# Patient Record
Sex: Male | Born: 1935 | Race: White | Hispanic: No | Marital: Married | Smoking: Former smoker
Health system: Southern US, Community
[De-identification: ages and names within clinical notes are randomized; demographics above are authoritative.]

## PROBLEM LIST (undated history)

## (undated) DIAGNOSIS — E872 Acidosis, unspecified: Secondary | ICD-10-CM

## (undated) DIAGNOSIS — I5021 Acute systolic (congestive) heart failure: Secondary | ICD-10-CM

## (undated) DIAGNOSIS — R7881 Bacteremia: Secondary | ICD-10-CM

## (undated) DIAGNOSIS — J151 Pneumonia due to Pseudomonas: Secondary | ICD-10-CM

## (undated) HISTORY — PX: HERNIA REPAIR: SHX51

## (undated) HISTORY — DX: Pneumonia due to Pseudomonas: J15.1

## (undated) HISTORY — DX: Acute systolic (congestive) heart failure: I50.21

## (undated) HISTORY — DX: Bacteremia: R78.81

## (undated) HISTORY — PX: CHOLECYSTECTOMY: SHX55

## (undated) HISTORY — DX: Acidosis, unspecified: E87.20

---

## 2014-08-28 DIAGNOSIS — R7301 Impaired fasting glucose: Secondary | ICD-10-CM | POA: Diagnosis not present

## 2014-08-28 DIAGNOSIS — Z1389 Encounter for screening for other disorder: Secondary | ICD-10-CM | POA: Diagnosis not present

## 2014-08-28 DIAGNOSIS — R972 Elevated prostate specific antigen [PSA]: Secondary | ICD-10-CM | POA: Diagnosis not present

## 2014-08-28 DIAGNOSIS — Z9181 History of falling: Secondary | ICD-10-CM | POA: Diagnosis not present

## 2014-08-28 DIAGNOSIS — Z139 Encounter for screening, unspecified: Secondary | ICD-10-CM | POA: Diagnosis not present

## 2014-08-28 DIAGNOSIS — Z0000 Encounter for general adult medical examination without abnormal findings: Secondary | ICD-10-CM | POA: Diagnosis not present

## 2014-08-28 DIAGNOSIS — Z125 Encounter for screening for malignant neoplasm of prostate: Secondary | ICD-10-CM | POA: Diagnosis not present

## 2014-08-28 DIAGNOSIS — E785 Hyperlipidemia, unspecified: Secondary | ICD-10-CM | POA: Diagnosis not present

## 2014-11-03 DIAGNOSIS — H25811 Combined forms of age-related cataract, right eye: Secondary | ICD-10-CM | POA: Diagnosis not present

## 2014-12-15 DIAGNOSIS — D1801 Hemangioma of skin and subcutaneous tissue: Secondary | ICD-10-CM | POA: Diagnosis not present

## 2014-12-15 DIAGNOSIS — L720 Epidermal cyst: Secondary | ICD-10-CM | POA: Diagnosis not present

## 2014-12-15 DIAGNOSIS — L821 Other seborrheic keratosis: Secondary | ICD-10-CM | POA: Diagnosis not present

## 2015-02-27 DIAGNOSIS — Z23 Encounter for immunization: Secondary | ICD-10-CM | POA: Diagnosis not present

## 2015-03-11 DIAGNOSIS — J208 Acute bronchitis due to other specified organisms: Secondary | ICD-10-CM | POA: Diagnosis not present

## 2015-03-11 DIAGNOSIS — Z6821 Body mass index (BMI) 21.0-21.9, adult: Secondary | ICD-10-CM | POA: Diagnosis not present

## 2015-03-11 DIAGNOSIS — H6123 Impacted cerumen, bilateral: Secondary | ICD-10-CM | POA: Diagnosis not present

## 2015-07-10 DIAGNOSIS — J309 Allergic rhinitis, unspecified: Secondary | ICD-10-CM | POA: Diagnosis not present

## 2015-07-10 DIAGNOSIS — Z6821 Body mass index (BMI) 21.0-21.9, adult: Secondary | ICD-10-CM | POA: Diagnosis not present

## 2015-07-10 DIAGNOSIS — J019 Acute sinusitis, unspecified: Secondary | ICD-10-CM | POA: Diagnosis not present

## 2015-08-31 DIAGNOSIS — R7301 Impaired fasting glucose: Secondary | ICD-10-CM | POA: Diagnosis not present

## 2015-08-31 DIAGNOSIS — Z0000 Encounter for general adult medical examination without abnormal findings: Secondary | ICD-10-CM | POA: Diagnosis not present

## 2015-08-31 DIAGNOSIS — Z1389 Encounter for screening for other disorder: Secondary | ICD-10-CM | POA: Diagnosis not present

## 2015-08-31 DIAGNOSIS — Z9181 History of falling: Secondary | ICD-10-CM | POA: Diagnosis not present

## 2015-08-31 DIAGNOSIS — E785 Hyperlipidemia, unspecified: Secondary | ICD-10-CM | POA: Diagnosis not present

## 2015-08-31 DIAGNOSIS — Z23 Encounter for immunization: Secondary | ICD-10-CM | POA: Diagnosis not present

## 2015-08-31 DIAGNOSIS — R972 Elevated prostate specific antigen [PSA]: Secondary | ICD-10-CM | POA: Diagnosis not present

## 2015-09-08 DIAGNOSIS — C61 Malignant neoplasm of prostate: Secondary | ICD-10-CM | POA: Diagnosis not present

## 2015-09-08 DIAGNOSIS — N403 Nodular prostate with lower urinary tract symptoms: Secondary | ICD-10-CM | POA: Diagnosis not present

## 2015-09-08 DIAGNOSIS — N4232 Atypical small acinar proliferation of prostate: Secondary | ICD-10-CM | POA: Diagnosis not present

## 2015-09-08 DIAGNOSIS — R972 Elevated prostate specific antigen [PSA]: Secondary | ICD-10-CM | POA: Diagnosis not present

## 2015-09-08 DIAGNOSIS — N401 Enlarged prostate with lower urinary tract symptoms: Secondary | ICD-10-CM | POA: Diagnosis not present

## 2015-09-15 DIAGNOSIS — R972 Elevated prostate specific antigen [PSA]: Secondary | ICD-10-CM | POA: Diagnosis not present

## 2015-09-15 DIAGNOSIS — C61 Malignant neoplasm of prostate: Secondary | ICD-10-CM | POA: Diagnosis not present

## 2015-09-15 DIAGNOSIS — N403 Nodular prostate with lower urinary tract symptoms: Secondary | ICD-10-CM | POA: Diagnosis not present

## 2015-09-29 DIAGNOSIS — C61 Malignant neoplasm of prostate: Secondary | ICD-10-CM | POA: Diagnosis not present

## 2015-10-23 DIAGNOSIS — Z6821 Body mass index (BMI) 21.0-21.9, adult: Secondary | ICD-10-CM | POA: Diagnosis not present

## 2015-10-23 DIAGNOSIS — M25422 Effusion, left elbow: Secondary | ICD-10-CM | POA: Diagnosis not present

## 2015-12-07 DIAGNOSIS — Z6821 Body mass index (BMI) 21.0-21.9, adult: Secondary | ICD-10-CM | POA: Diagnosis not present

## 2015-12-07 DIAGNOSIS — J309 Allergic rhinitis, unspecified: Secondary | ICD-10-CM | POA: Diagnosis not present

## 2016-01-01 DIAGNOSIS — Z23 Encounter for immunization: Secondary | ICD-10-CM | POA: Diagnosis not present

## 2016-03-29 DIAGNOSIS — C61 Malignant neoplasm of prostate: Secondary | ICD-10-CM | POA: Diagnosis not present

## 2016-03-31 DIAGNOSIS — C61 Malignant neoplasm of prostate: Secondary | ICD-10-CM | POA: Diagnosis not present

## 2016-04-08 DIAGNOSIS — H2512 Age-related nuclear cataract, left eye: Secondary | ICD-10-CM | POA: Diagnosis not present

## 2016-04-08 DIAGNOSIS — H25811 Combined forms of age-related cataract, right eye: Secondary | ICD-10-CM | POA: Diagnosis not present

## 2016-05-20 DIAGNOSIS — H25811 Combined forms of age-related cataract, right eye: Secondary | ICD-10-CM | POA: Diagnosis not present

## 2016-06-14 DIAGNOSIS — Z79899 Other long term (current) drug therapy: Secondary | ICD-10-CM | POA: Diagnosis not present

## 2016-06-14 DIAGNOSIS — Z87891 Personal history of nicotine dependence: Secondary | ICD-10-CM | POA: Diagnosis not present

## 2016-06-14 DIAGNOSIS — Z7982 Long term (current) use of aspirin: Secondary | ICD-10-CM | POA: Diagnosis not present

## 2016-06-14 DIAGNOSIS — H25811 Combined forms of age-related cataract, right eye: Secondary | ICD-10-CM | POA: Diagnosis not present

## 2016-07-26 DIAGNOSIS — H25812 Combined forms of age-related cataract, left eye: Secondary | ICD-10-CM | POA: Diagnosis not present

## 2016-07-26 DIAGNOSIS — Z87891 Personal history of nicotine dependence: Secondary | ICD-10-CM | POA: Diagnosis not present

## 2016-09-01 DIAGNOSIS — Z125 Encounter for screening for malignant neoplasm of prostate: Secondary | ICD-10-CM | POA: Diagnosis not present

## 2016-09-01 DIAGNOSIS — Z0000 Encounter for general adult medical examination without abnormal findings: Secondary | ICD-10-CM | POA: Diagnosis not present

## 2016-09-01 DIAGNOSIS — E785 Hyperlipidemia, unspecified: Secondary | ICD-10-CM | POA: Diagnosis not present

## 2016-09-01 DIAGNOSIS — C61 Malignant neoplasm of prostate: Secondary | ICD-10-CM | POA: Diagnosis not present

## 2016-09-01 DIAGNOSIS — R7301 Impaired fasting glucose: Secondary | ICD-10-CM | POA: Diagnosis not present

## 2016-09-01 DIAGNOSIS — Z139 Encounter for screening, unspecified: Secondary | ICD-10-CM | POA: Diagnosis not present

## 2016-09-01 DIAGNOSIS — Z9181 History of falling: Secondary | ICD-10-CM | POA: Diagnosis not present

## 2016-12-29 DIAGNOSIS — Z23 Encounter for immunization: Secondary | ICD-10-CM | POA: Diagnosis not present

## 2017-02-14 DIAGNOSIS — H26493 Other secondary cataract, bilateral: Secondary | ICD-10-CM | POA: Diagnosis not present

## 2017-05-09 DIAGNOSIS — D485 Neoplasm of uncertain behavior of skin: Secondary | ICD-10-CM | POA: Diagnosis not present

## 2017-05-09 DIAGNOSIS — L7211 Pilar cyst: Secondary | ICD-10-CM | POA: Diagnosis not present

## 2017-05-18 DIAGNOSIS — L7211 Pilar cyst: Secondary | ICD-10-CM | POA: Diagnosis not present

## 2017-09-07 DIAGNOSIS — Z9181 History of falling: Secondary | ICD-10-CM | POA: Diagnosis not present

## 2017-09-07 DIAGNOSIS — Z0000 Encounter for general adult medical examination without abnormal findings: Secondary | ICD-10-CM | POA: Diagnosis not present

## 2017-09-07 DIAGNOSIS — E785 Hyperlipidemia, unspecified: Secondary | ICD-10-CM | POA: Diagnosis not present

## 2017-09-07 DIAGNOSIS — R7301 Impaired fasting glucose: Secondary | ICD-10-CM | POA: Diagnosis not present

## 2017-09-07 DIAGNOSIS — Z1339 Encounter for screening examination for other mental health and behavioral disorders: Secondary | ICD-10-CM | POA: Diagnosis not present

## 2017-09-07 DIAGNOSIS — C61 Malignant neoplasm of prostate: Secondary | ICD-10-CM | POA: Diagnosis not present

## 2017-09-07 DIAGNOSIS — Z125 Encounter for screening for malignant neoplasm of prostate: Secondary | ICD-10-CM | POA: Diagnosis not present

## 2017-09-07 DIAGNOSIS — Z139 Encounter for screening, unspecified: Secondary | ICD-10-CM | POA: Diagnosis not present

## 2017-12-29 DIAGNOSIS — Z23 Encounter for immunization: Secondary | ICD-10-CM | POA: Diagnosis not present

## 2018-02-14 DIAGNOSIS — H26493 Other secondary cataract, bilateral: Secondary | ICD-10-CM | POA: Diagnosis not present

## 2018-09-10 DIAGNOSIS — Z0000 Encounter for general adult medical examination without abnormal findings: Secondary | ICD-10-CM | POA: Diagnosis not present

## 2018-09-10 DIAGNOSIS — E785 Hyperlipidemia, unspecified: Secondary | ICD-10-CM | POA: Diagnosis not present

## 2018-09-10 DIAGNOSIS — R7301 Impaired fasting glucose: Secondary | ICD-10-CM | POA: Diagnosis not present

## 2018-11-03 DIAGNOSIS — H6122 Impacted cerumen, left ear: Secondary | ICD-10-CM | POA: Diagnosis not present

## 2018-11-03 DIAGNOSIS — Z6820 Body mass index (BMI) 20.0-20.9, adult: Secondary | ICD-10-CM | POA: Diagnosis not present

## 2018-11-03 DIAGNOSIS — H9192 Unspecified hearing loss, left ear: Secondary | ICD-10-CM | POA: Diagnosis not present

## 2019-02-27 DIAGNOSIS — Z961 Presence of intraocular lens: Secondary | ICD-10-CM | POA: Diagnosis not present

## 2019-08-05 DIAGNOSIS — L814 Other melanin hyperpigmentation: Secondary | ICD-10-CM | POA: Diagnosis not present

## 2019-08-05 DIAGNOSIS — L820 Inflamed seborrheic keratosis: Secondary | ICD-10-CM | POA: Diagnosis not present

## 2019-08-05 DIAGNOSIS — C4442 Squamous cell carcinoma of skin of scalp and neck: Secondary | ICD-10-CM | POA: Diagnosis not present

## 2019-09-19 DIAGNOSIS — Z0000 Encounter for general adult medical examination without abnormal findings: Secondary | ICD-10-CM | POA: Diagnosis not present

## 2019-09-19 DIAGNOSIS — R7301 Impaired fasting glucose: Secondary | ICD-10-CM | POA: Diagnosis not present

## 2019-09-19 DIAGNOSIS — Z139 Encounter for screening, unspecified: Secondary | ICD-10-CM | POA: Diagnosis not present

## 2019-09-19 DIAGNOSIS — Z9181 History of falling: Secondary | ICD-10-CM | POA: Diagnosis not present

## 2019-09-19 DIAGNOSIS — E785 Hyperlipidemia, unspecified: Secondary | ICD-10-CM | POA: Diagnosis not present

## 2019-12-31 DIAGNOSIS — Z23 Encounter for immunization: Secondary | ICD-10-CM | POA: Diagnosis not present

## 2020-03-05 DIAGNOSIS — H527 Unspecified disorder of refraction: Secondary | ICD-10-CM | POA: Diagnosis not present

## 2020-03-05 DIAGNOSIS — Z961 Presence of intraocular lens: Secondary | ICD-10-CM | POA: Diagnosis not present

## 2020-03-05 DIAGNOSIS — H1131 Conjunctival hemorrhage, right eye: Secondary | ICD-10-CM | POA: Diagnosis not present

## 2020-09-24 DIAGNOSIS — Z0000 Encounter for general adult medical examination without abnormal findings: Secondary | ICD-10-CM | POA: Diagnosis not present

## 2020-12-29 DIAGNOSIS — Z23 Encounter for immunization: Secondary | ICD-10-CM | POA: Diagnosis not present

## 2021-03-11 DIAGNOSIS — Z961 Presence of intraocular lens: Secondary | ICD-10-CM | POA: Diagnosis not present

## 2021-04-13 DIAGNOSIS — J019 Acute sinusitis, unspecified: Secondary | ICD-10-CM | POA: Diagnosis not present

## 2021-04-13 DIAGNOSIS — J208 Acute bronchitis due to other specified organisms: Secondary | ICD-10-CM | POA: Diagnosis not present

## 2021-04-13 DIAGNOSIS — B9689 Other specified bacterial agents as the cause of diseases classified elsewhere: Secondary | ICD-10-CM | POA: Diagnosis not present

## 2021-08-17 DIAGNOSIS — I63412 Cerebral infarction due to embolism of left middle cerebral artery: Secondary | ICD-10-CM | POA: Diagnosis not present

## 2021-08-17 DIAGNOSIS — R200 Anesthesia of skin: Secondary | ICD-10-CM | POA: Diagnosis not present

## 2021-08-17 DIAGNOSIS — R29705 NIHSS score 5: Secondary | ICD-10-CM | POA: Diagnosis not present

## 2021-08-17 DIAGNOSIS — I63512 Cerebral infarction due to unspecified occlusion or stenosis of left middle cerebral artery: Secondary | ICD-10-CM | POA: Diagnosis not present

## 2021-08-17 DIAGNOSIS — R471 Dysarthria and anarthria: Secondary | ICD-10-CM | POA: Diagnosis not present

## 2021-08-17 DIAGNOSIS — G8191 Hemiplegia, unspecified affecting right dominant side: Secondary | ICD-10-CM | POA: Diagnosis not present

## 2021-08-17 DIAGNOSIS — R29810 Facial weakness: Secondary | ICD-10-CM | POA: Diagnosis not present

## 2021-08-17 DIAGNOSIS — I6523 Occlusion and stenosis of bilateral carotid arteries: Secondary | ICD-10-CM | POA: Diagnosis not present

## 2021-08-17 DIAGNOSIS — R404 Transient alteration of awareness: Secondary | ICD-10-CM | POA: Diagnosis not present

## 2021-08-17 DIAGNOSIS — R531 Weakness: Secondary | ICD-10-CM | POA: Diagnosis not present

## 2021-08-17 DIAGNOSIS — Z87891 Personal history of nicotine dependence: Secondary | ICD-10-CM | POA: Diagnosis not present

## 2021-08-17 DIAGNOSIS — I6602 Occlusion and stenosis of left middle cerebral artery: Secondary | ICD-10-CM | POA: Diagnosis not present

## 2021-08-17 DIAGNOSIS — Z743 Need for continuous supervision: Secondary | ICD-10-CM | POA: Diagnosis not present

## 2021-08-17 DIAGNOSIS — R6889 Other general symptoms and signs: Secondary | ICD-10-CM | POA: Diagnosis not present

## 2021-08-18 ENCOUNTER — Inpatient Hospital Stay (HOSPITAL_COMMUNITY): Payer: Medicare Other

## 2021-08-18 ENCOUNTER — Other Ambulatory Visit: Payer: Self-pay

## 2021-08-18 ENCOUNTER — Encounter (HOSPITAL_COMMUNITY): Payer: Self-pay | Admitting: Neurology

## 2021-08-18 ENCOUNTER — Inpatient Hospital Stay (HOSPITAL_COMMUNITY)
Admission: EM | Admit: 2021-08-18 | Discharge: 2021-08-20 | DRG: 065 | Disposition: A | Discharge status: Home / Self Care | Payer: Medicare Other | Source: Other Acute Inpatient Hospital | Attending: Neurology | Admitting: Neurology

## 2021-08-18 DIAGNOSIS — Y848 Other medical procedures as the cause of abnormal reaction of the patient, or of later complication, without mention of misadventure at the time of the procedure: Secondary | ICD-10-CM | POA: Diagnosis not present

## 2021-08-18 DIAGNOSIS — R4701 Aphasia: Secondary | ICD-10-CM | POA: Diagnosis not present

## 2021-08-18 DIAGNOSIS — I639 Cerebral infarction, unspecified: Secondary | ICD-10-CM | POA: Diagnosis not present

## 2021-08-18 DIAGNOSIS — G8191 Hemiplegia, unspecified affecting right dominant side: Secondary | ICD-10-CM | POA: Diagnosis present

## 2021-08-18 DIAGNOSIS — E785 Hyperlipidemia, unspecified: Secondary | ICD-10-CM | POA: Diagnosis present

## 2021-08-18 DIAGNOSIS — I63512 Cerebral infarction due to unspecified occlusion or stenosis of left middle cerebral artery: Secondary | ICD-10-CM

## 2021-08-18 DIAGNOSIS — Z9282 Status post administration of tPA (rtPA) in a different facility within the last 24 hours prior to admission to current facility: Secondary | ICD-10-CM | POA: Diagnosis not present

## 2021-08-18 DIAGNOSIS — S40021A Contusion of right upper arm, initial encounter: Secondary | ICD-10-CM | POA: Diagnosis not present

## 2021-08-18 DIAGNOSIS — I4821 Permanent atrial fibrillation: Secondary | ICD-10-CM

## 2021-08-18 DIAGNOSIS — H5347 Heteronymous bilateral field defects: Secondary | ICD-10-CM | POA: Diagnosis present

## 2021-08-18 DIAGNOSIS — I10 Essential (primary) hypertension: Secondary | ICD-10-CM | POA: Diagnosis present

## 2021-08-18 DIAGNOSIS — R29810 Facial weakness: Secondary | ICD-10-CM | POA: Diagnosis not present

## 2021-08-18 DIAGNOSIS — G319 Degenerative disease of nervous system, unspecified: Secondary | ICD-10-CM | POA: Diagnosis not present

## 2021-08-18 DIAGNOSIS — I4891 Unspecified atrial fibrillation: Secondary | ICD-10-CM | POA: Diagnosis present

## 2021-08-18 DIAGNOSIS — E7800 Pure hypercholesterolemia, unspecified: Secondary | ICD-10-CM | POA: Diagnosis not present

## 2021-08-18 DIAGNOSIS — I4819 Other persistent atrial fibrillation: Secondary | ICD-10-CM

## 2021-08-18 DIAGNOSIS — I482 Chronic atrial fibrillation, unspecified: Secondary | ICD-10-CM | POA: Diagnosis not present

## 2021-08-18 HISTORY — DX: Cerebral infarction due to unspecified occlusion or stenosis of left middle cerebral artery: I63.512

## 2021-08-18 LAB — ECHOCARDIOGRAM COMPLETE
AR max vel: 2.46 cm2
AV Area VTI: 2.44 cm2
AV Area mean vel: 2.38 cm2
AV Mean grad: 3.6 mmHg
AV Peak grad: 6.9 mmHg
Ao pk vel: 1.32 m/s
Area-P 1/2: 4.58 cm2
Calc EF: 42.7 %
Height: 67 in
MV M vel: 4.95 m/s
MV Peak grad: 98 mmHg
MV VTI: 2.2 cm2
Radius: 0.5 cm
S' Lateral: 3.4 cm
Single Plane A2C EF: 39.4 %
Single Plane A4C EF: 43.1 %
Weight: 1982.38 oz

## 2021-08-18 LAB — TSH: TSH: 1.415 u[IU]/mL (ref 0.350–4.500)

## 2021-08-18 LAB — LIPID PANEL
Cholesterol: 132 mg/dL (ref 0–200)
HDL: 50 mg/dL (ref >40)
LDL Cholesterol: 78 mg/dL (ref 0–99)
Total CHOL/HDL Ratio: 2.6 RATIO
Triglycerides: 20 mg/dL (ref <150)
VLDL: 4 mg/dL (ref 0–40)

## 2021-08-18 LAB — MRSA NEXT GEN BY PCR, NASAL: MRSA by PCR Next Gen: NOT DETECTED

## 2021-08-18 LAB — HEMOGLOBIN A1C
Hgb A1c MFr Bld: 5.5 % (ref 4.8–5.6)
Mean Plasma Glucose: 111.15 mg/dL

## 2021-08-18 IMAGING — MR MR MRA HEAD W/O CM
1 series · 17 of 48 positions shown · non-contrast
Comparison: Prior CT from [DATE].

CLINICAL DATA: Follow-up examination for acute stroke.

EXAM:
MRI HEAD WITHOUT CONTRAST
MRA HEAD WITHOUT CONTRAST
TECHNIQUE: Multiplanar, multi-echo pulse sequences of the brain and surrounding
structures were acquired without intravenous contrast. Angiographic
images of the Circle of Willis were acquired using MRA technique
without intravenous contrast.

[Series 3: ax (id) · axial · 1.0mm · 0.43mm/px · z∈[-107,-15]mm · 17 of 197 slices shown]
[im 1/197]
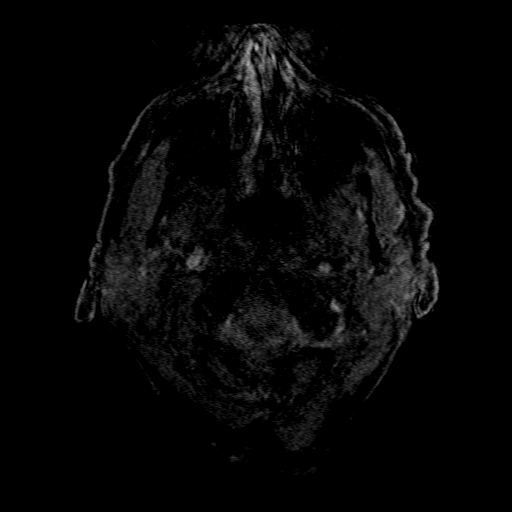
[im 5/197]
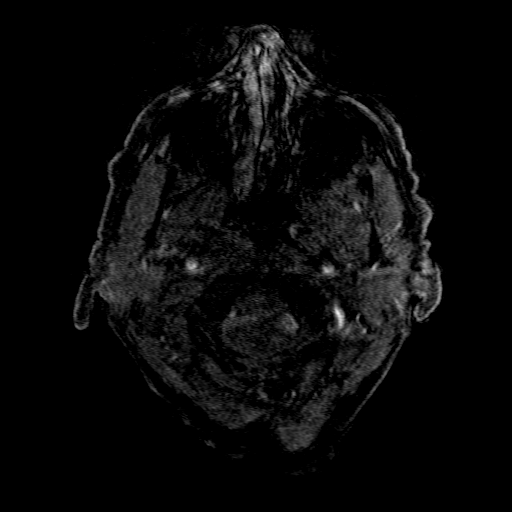
[im 9/197]
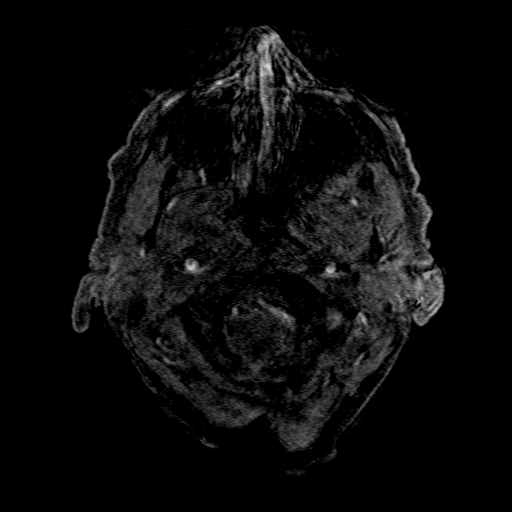
[im 13/197]
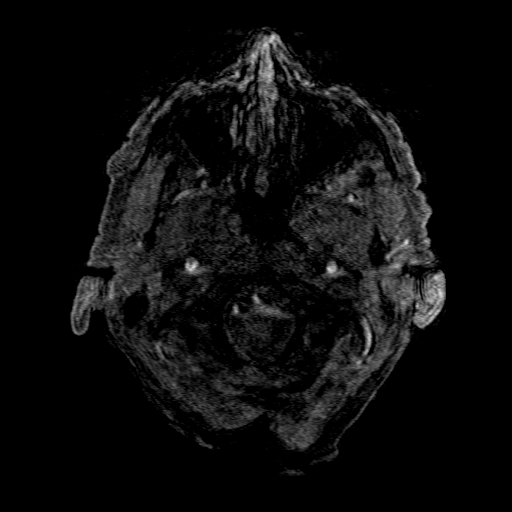
[im 17/197]
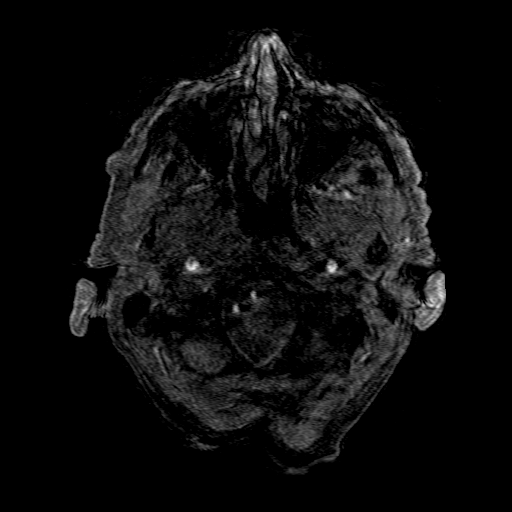
[im 21/197]
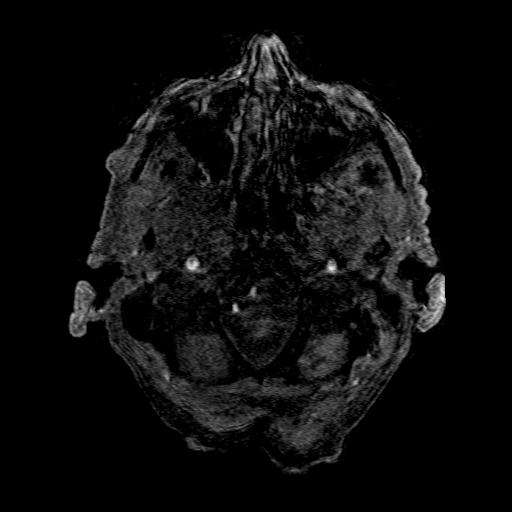
[im 26/197]
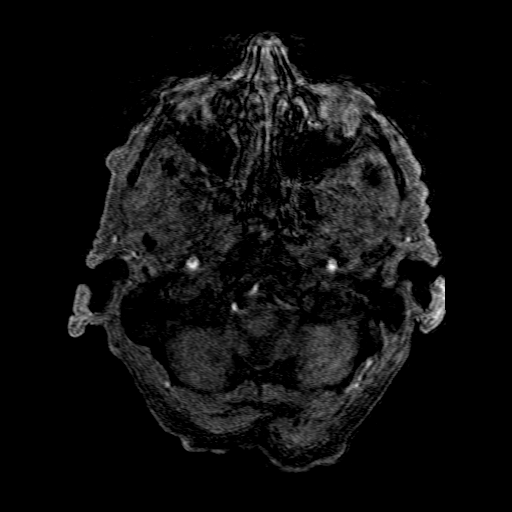
[im 34/197]
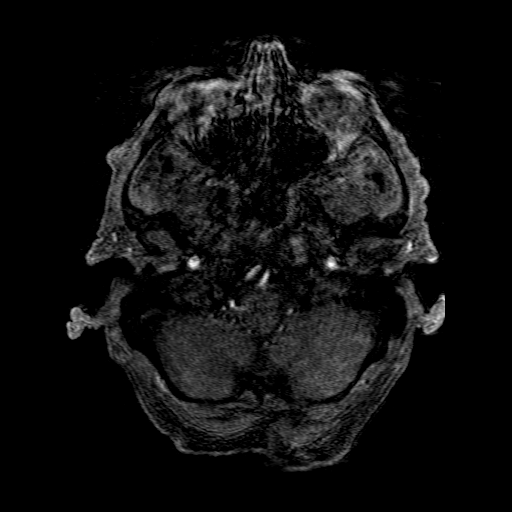
[im 38/197]
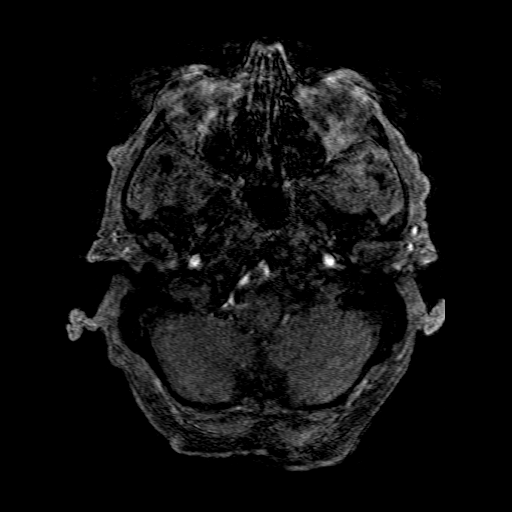
[im 63/197]
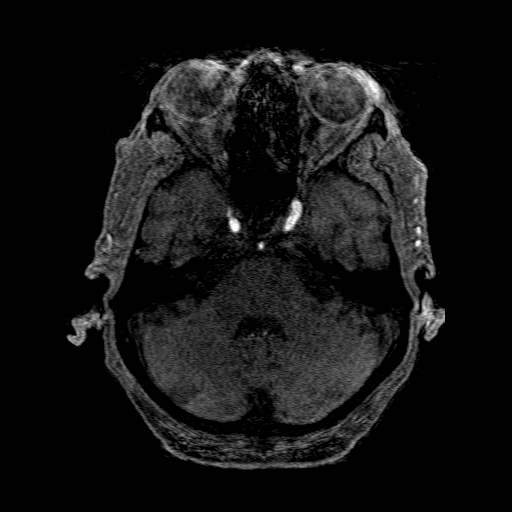
[im 88/197]
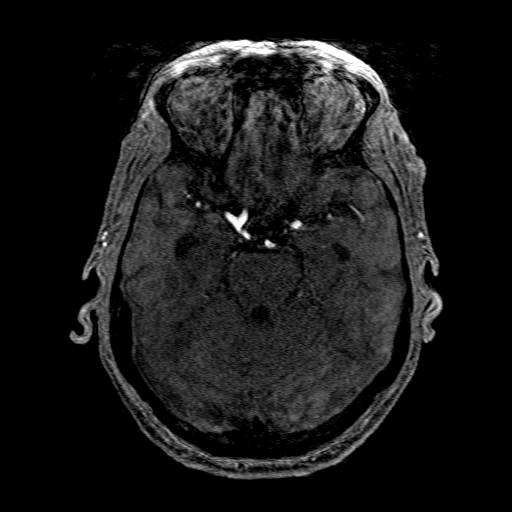
[im 101/197]
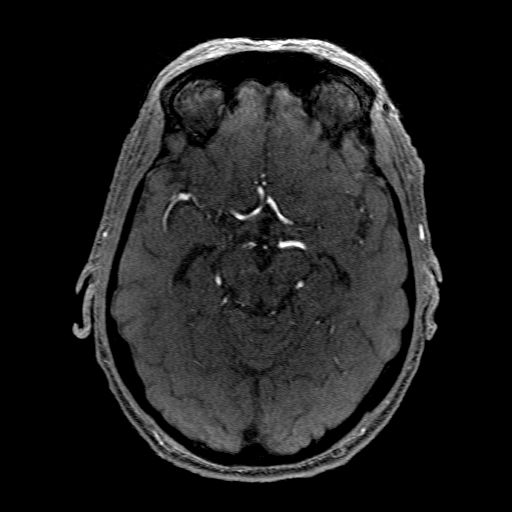
[im 113/197]
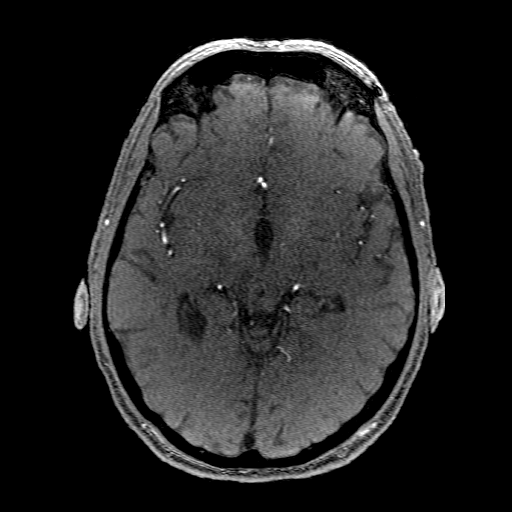
[im 138/197]
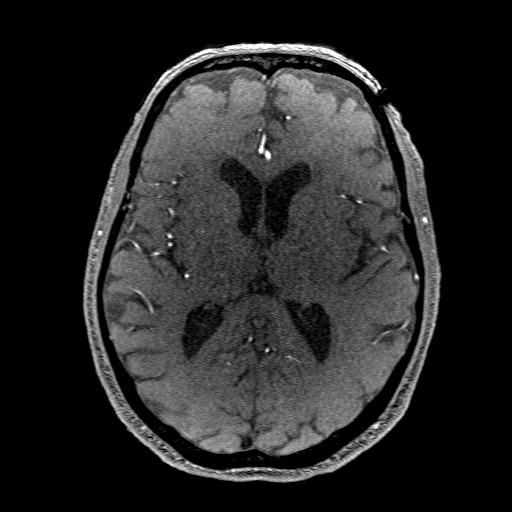
[im 163/197]
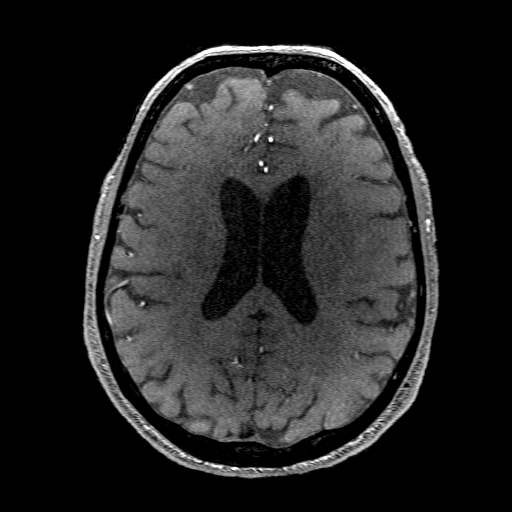
[im 167/197]
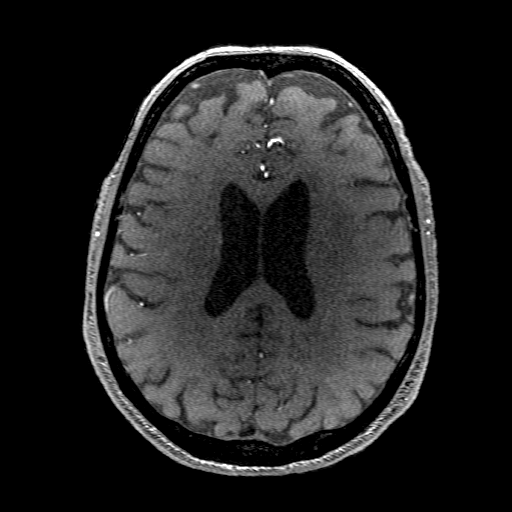
[im 188/197]
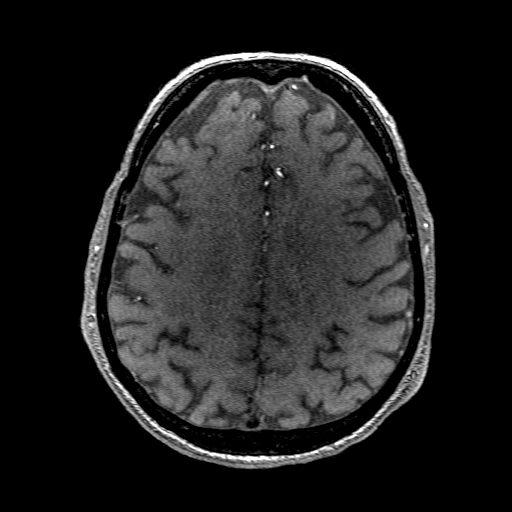

[17 of 48 positions shown; findings below may reference images not displayed]

FINDINGS: MRI HEAD FINDINGS

Brain: Examination degraded by motion artifact.

Generalized age-related cerebral atrophy. Patchy and confluent
T2/FLAIR hyperintensity involving the periventricular and deep white
matter both cerebral hemispheres as well as the pons, most
consistent with chronic small vessel ischemic disease, moderately
advanced in nature.

Patchy small volume restricted diffusion seen involving the left
insular/subinsular region, within additional patchy cortical and
subcortical involvement of the overlying left frontal lobe,
consistent with acute left MCA distribution infarct. No associated
hemorrhage or significant mass effect. No other evidence for acute
or subacute ischemia. Gray-white matter differentiation otherwise
maintained. No visible acute or chronic intracranial blood products.

No mass lesion, midline shift or mass effect. No hydrocephalus or
extra-axial fluid collection. Pituitary gland suprasellar region
within normal limits.

Vascular: Susceptibility artifacts seen involving the proximal left
MCA at the base of the left sylvian fissure, likely reflecting
thrombus (series 8, image 38, 45). Major intracranial vascular flow
voids are otherwise maintained.

Skull and upper cervical spine: Basal junction within normal limits.
Bone marrow signal intensity grossly normal. No scalp soft tissue
abnormality.

Sinuses/Orbits: Prior bilateral ocular lens replacement. Scattered
mucosal thickening noted throughout the ethmoidal air cells. No
significant mastoid effusion.

Other: None.

MRA HEAD FINDINGS

Anterior circulation: Examination moderately degraded by motion
artifact.

Distal cervical segments of the internal carotid arteries are patent
with antegrade flow. Both internal carotid arteries remain widely
patent to the termini. Note again made of an approximate 2 mm focal
outpouching extending laterally from the cavernous left ICA (series
3, image 67), which could reflect a small aneurysm versus vascular
infundibulum. Additional small funnel shaped outpouching arising
from the contralateral right carotid siphon consistent with a
vascular infundibulum. A1 segments patent with short-segment
fenestration on the left. Normal anterior communicating complex.
Anterior cerebral arteries remain widely patent. Right MCA and its
branch vessels are widely patent.

On the left, the left M1 segment remains widely patent. There is
persistent occlusive thrombus within a proximal left M2 branch just
distal to the bifurcation. This corresponds with susceptibility
artifacts seen on brain MRI, and is relatively stable from prior
CTA. Remainder of the left MCA branches remain patent and perfused.

Posterior circulation: Both V4 segments patent to the
vertebrobasilar junction. Both PICA patent proximally. Basilar
patent to its distal aspect without stenosis. Superior cerebellar
arteries patent bilaterally. Left PCA primarily supplied via the
basilar. Predominant fetal type origin of the right PCA. PCAs remain
widely patent to their distal aspects.

Anatomic variants: None significant.
IMPRESSION: MRI HEAD IMPRESSION:

1. Patchy small volume acute ischemic infarcts involving the left
insular/subinsular region as well as the overlying left frontal
lobe. No associated hemorrhage or significant mass effect.
2. Underlying age-related cerebral atrophy with moderate chronic
microvascular ischemic disease.

MRA HEAD IMPRESSION:

1. Persistent occlusive thrombus within a proximal left M2 branch
just distal to the bifurcation. Finding is relatively stable as
compared to prior CTA.
2. Otherwise wide patency of the intracranial arterial circulation.
No hemodynamically significant or correctable stenosis.
3. 2 mm focal outpouching extending laterally from the cavernous
left ICA, which could reflect a small aneurysm versus vascular
infundibulum. Attention at follow-up recommended.

## 2021-08-18 MED ORDER — ACETAMINOPHEN 325 MG PO TABS: 650.0000 mg | ORAL_TABLET | ORAL | Status: DC | PRN

## 2021-08-18 MED ORDER — PANTOPRAZOLE SODIUM 40 MG PO TBEC
40.0000 mg | DELAYED_RELEASE_TABLET | Freq: Every day | ORAL | Status: DC
  Administered 2021-08-19 – 2021-08-20 (×2): 40 mg via ORAL
  Filled 2021-08-18 (×2): qty 1

## 2021-08-18 MED ORDER — ACETAMINOPHEN 650 MG RE SUPP: 650.0000 mg | RECTAL | Status: DC | PRN

## 2021-08-18 MED ORDER — PANTOPRAZOLE SODIUM 40 MG IV SOLR
40.0000 mg | Freq: Every day | INTRAVENOUS | Status: DC
Start: 2021-08-18 — End: 2021-08-18

## 2021-08-18 MED ORDER — ASPIRIN 325 MG PO TBEC
325.0000 mg | DELAYED_RELEASE_TABLET | Freq: Once | ORAL | Status: AC
  Administered 2021-08-18: 325 mg via ORAL
  Filled 2021-08-18: qty 1

## 2021-08-18 MED ORDER — SODIUM CHLORIDE 0.9 % IV SOLN
INTRAVENOUS | Status: DC
Start: 2021-08-18 — End: 2021-08-18

## 2021-08-18 MED ORDER — SENNOSIDES-DOCUSATE SODIUM 8.6-50 MG PO TABS: 1.0000 | ORAL_TABLET | Freq: Every evening | ORAL | Status: DC | PRN

## 2021-08-18 MED ORDER — ENOXAPARIN SODIUM 40 MG/0.4ML IJ SOSY: 40.0000 mg | PREFILLED_SYRINGE | INTRAMUSCULAR | Status: DC

## 2021-08-18 MED ORDER — ROSUVASTATIN CALCIUM 5 MG PO TABS
10.0000 mg | ORAL_TABLET | Freq: Every day | ORAL | Status: DC
  Administered 2021-08-18 – 2021-08-20 (×3): 10 mg via ORAL
  Filled 2021-08-18 (×3): qty 2

## 2021-08-18 MED ORDER — CHLORHEXIDINE GLUCONATE CLOTH 2 % EX PADS
6.0000 | MEDICATED_PAD | Freq: Every day | CUTANEOUS | Status: DC
Start: 2021-08-18 — End: 2021-08-20
  Administered 2021-08-18 – 2021-08-20 (×3): 6 via TOPICAL

## 2021-08-18 MED ORDER — STROKE: EARLY STAGES OF RECOVERY BOOK
Freq: Once | Status: AC
Start: 2021-08-18 — End: 2021-08-18
  Filled 2021-08-18: qty 1

## 2021-08-18 MED ORDER — ACETAMINOPHEN 160 MG/5ML PO SOLN: 650.0000 mg | ORAL | Status: DC | PRN

## 2021-08-18 NOTE — Progress Notes (Signed)
STROKE TEAM PROGRESS NOTE   SUBJECTIVE (INTERVAL HISTORY) His wife and daughter are at the bedside.  Overall his condition is rapidly improving. Pt still has mild expressive aphasia and right arm weakness more proximal, partially due to RUE pain from extensive ecchymosis and soft tissue hematoma. Pending MRI and MRA tonight. Still in afib, rate controlled, asymptomatic    OBJECTIVE Temp:  [97.9 F (36.6 C)-98.2 F (36.8 C)] 98.2 F (36.8 C) (05/31 1152) Pulse Rate:  [71-92] 77 (05/31 1200) Cardiac Rhythm: Atrial fibrillation (05/31 0800) Resp:  [14-30] 19 (05/31 1200) BP: (119-172)/(67-112) 129/75 (05/31 1200) SpO2:  [92 %-100 %] 94 % (05/31 1200) Weight:  [56.2 kg] 56.2 kg (05/31 0124)  No results for input(s): GLUCAP in the last 168 hours. No results for input(s): NA, K, CL, CO2, GLUCOSE, BUN, CREATININE, CALCIUM, MG, PHOS in the last 168 hours. No results for input(s): AST, ALT, ALKPHOS, BILITOT, PROT, ALBUMIN in the last 168 hours. No results for input(s): WBC, NEUTROABS, HGB, HCT, MCV, PLT in the last 168 hours. No results for input(s): CKTOTAL, CKMB, CKMBINDEX, TROPONINI in the last 168 hours. No results for input(s): LABPROT, INR in the last 72 hours. No results for input(s): COLORURINE, LABSPEC, [place], GLUCOSEU, HGBUR, BILIRUBINUR, KETONESUR, PROTEINUR, UROBILINOGEN, NITRITE, LEUKOCYTESUR in the last 72 hours.  Invalid input(s): APPERANCEUR     Component Value Date/Time   CHOL 132 08/18/2021 0421   TRIG 20 08/18/2021 0421   HDL 50 08/18/2021 0421   CHOLHDL 2.6 08/18/2021 0421   VLDL 4 08/18/2021 0421   LDLCALC 78 08/18/2021 0421   Lab Results  Component Value Date   HGBA1C 5.5 08/18/2021   No results found for: LABOPIA, COCAINSCRNUR, LABBENZ, AMPHETMU, THCU, LABBARB  No results for input(s): ETH in the last 168 hours.  I have personally reviewed the radiological images below and agree with the radiology interpretations.  ECHOCARDIOGRAM COMPLETE  Result Date:  08/18/2021    ECHOCARDIOGRAM REPORT   Patient Name:   Robert Webb Date of Exam: 08/18/2021 Medical Rec #:  [id number]       Height:       67.0 in Accession #:    [id number]      Weight:       123.9 lb Date of Birth:  10-08-1934       BSA:          1.650 m Patient Age:    87 years        BP:           143/82 mmHg Patient Gender: M               HR:           70 bpm. Exam Location:  Inpatient Procedure: 2D Echo, Cardiac Doppler and Color Doppler Indications:    CVA  History:        Patient has no prior history of Echocardiogram examinations.  Sonographer:    Luisa Hart RDCS Referring Phys: [id number] [place]  1. Left ventricular ejection fraction, by estimation, is 45 to 50%. The left ventricle has mildly decreased function. The left ventricle demonstrates regional wall motion abnormalities (see scoring diagram/findings for description). There is severe concentric left ventricular hypertrophy. Left ventricular diastolic function could not be evaluated.  2. Right ventricular systolic function is normal. The right ventricular size is normal. Moderately increased right ventricular wall thickness. There is normal pulmonary artery systolic pressure.  3. Left atrial size was severely dilated.  4.  Right atrial size was severely dilated.  5. The mitral valve is normal in structure. Trivial mitral valve regurgitation. No evidence of mitral stenosis.  6. The aortic valve is tricuspid. Aortic valve regurgitation is trivial. No aortic stenosis is present.  7. Aortic dilatation noted. There is mild dilatation of the ascending aorta, measuring 37 mm. There is mild dilatation of the aortic root, measuring 40 mm.  8. The inferior vena cava is normal in size with <50% respiratory variability, suggesting right atrial pressure of 8 mmHg. FINDINGS  Left Ventricle: Left ventricular ejection fraction, by estimation, is 45 to 50%. The left ventricle has mildly decreased function. The left ventricle demonstrates  regional wall motion abnormalities. The left ventricular internal cavity size was normal in size. There is severe concentric left ventricular hypertrophy. Left ventricular diastolic function could not be evaluated due to atrial fibrillation. Left ventricular diastolic function could not be evaluated.  LV Wall Scoring: The antero-lateral wall, basal anteroseptal segment, basal inferolateral segment, basal anterior segment, basal inferior segment, and basal inferoseptal segment are hypokinetic. The mid and distal anterior wall, mid and distal lateral wall, mid and distal anterior septum, entire apex, mid and distal inferior wall, and mid inferoseptal segment are normal. Basal hypokinesis with sparing of the mid to apical segments. Reverse Takasubo physiology. Right Ventricle: The right ventricular size is normal. Moderately increased right ventricular wall thickness. Right ventricular systolic function is normal. There is normal pulmonary artery systolic pressure. The tricuspid regurgitant velocity is 2.28 m/s, and with an assumed right atrial pressure of 8 mmHg, the estimated right ventricular systolic pressure is 33.8 mmHg. Left Atrium: Left atrial size was severely dilated. Right Atrium: Right atrial size was severely dilated. Pericardium: Trivial pericardial effusion is present. Mitral Valve: The mitral valve is normal in structure. Trivial mitral valve regurgitation. No evidence of mitral valve stenosis. MV peak gradient, 5.3 mmHg. The mean mitral valve gradient is 2.0 mmHg. Tricuspid Valve: The tricuspid valve is normal in structure. Tricuspid valve regurgitation is trivial. No evidence of tricuspid stenosis. Aortic Valve: The aortic valve is tricuspid. Aortic valve regurgitation is trivial. No aortic stenosis is present. Aortic valve mean gradient measures 3.6 mmHg. Aortic valve peak gradient measures 6.9 mmHg. Aortic valve area, by VTI measures 2.44 cm. Pulmonic Valve: The pulmonic valve was normal in  structure. Pulmonic valve regurgitation is mild. No evidence of pulmonic stenosis. Aorta: Aortic dilatation noted. There is mild dilatation of the ascending aorta, measuring 37 mm. There is mild dilatation of the aortic root, measuring 40 mm. Venous: The inferior vena cava is normal in size with less than 50% respiratory variability, suggesting right atrial pressure of 8 mmHg. IAS/Shunts: No atrial level shunt detected by color flow Doppler. Additional Comments: Biventricular hypertrophy, severe atrial enlargement and pericardial effusion. Consider evaluation for amyloidosis or other infiltrative cardiomyopathies.  LEFT VENTRICLE PLAX 2D LVIDd:         4.20 cm LVIDs:         3.40 cm LV PW:         1.90 cm LV IVS:        1.80 cm LVOT diam:     2.30 cm LV SV:         55 LV SV Index:   33 LVOT Area:     4.15 cm  LV Volumes (MOD) LV vol d, MOD A2C: 61.4 ml LV vol d, MOD A4C: 58.3 ml LV vol s, MOD A2C: 37.2 ml LV vol s, MOD A4C: 33.2 ml LV  SV MOD A2C:     24.2 ml LV SV MOD A4C:     58.3 ml LV SV MOD BP:      25.3 ml RIGHT VENTRICLE RV Basal diam:  3.70 cm RV Mid diam:    1.20 cm RV S prime:     13.70 cm/s TAPSE (M-mode): 1.7 cm LEFT ATRIUM              Index        RIGHT ATRIUM           Index LA diam:        4.30 cm  2.61 cm/m   RA Area:     23.10 cm LA Vol (A2C):   125.0 ml 75.76 ml/m  RA Volume:   75.60 ml  45.82 ml/m LA Vol (A4C):   86.2 ml  52.25 ml/m LA Biplane Vol: 105.0 ml 63.64 ml/m  AORTIC VALVE                    PULMONIC VALVE AV Area (Vmax):    2.46 cm     PV Vmax:          0.95 m/s AV Area (Vmean):   2.38 cm     PV Vmean:         62.850 cm/s AV Area (VTI):     2.44 cm     PV VTI:           0.175 m AV Vmax:           131.80 cm/s  PV Peak grad:     3.6 mmHg AV Vmean:          86.880 cm/s  PV Mean grad:     2.0 mmHg AV VTI:            0.224 m      PR End Diast Vel: 8.41 msec AV Peak Grad:      6.9 mmHg AV Mean Grad:      3.6 mmHg LVOT Vmax:         77.95 cm/s LVOT Vmean:        49.775 cm/s LVOT VTI:           0.132 m LVOT/AV VTI ratio: 0.59  AORTA Ao Root diam: 4.00 cm Ao Asc diam:  3.70 cm MITRAL VALVE                  TRICUSPID VALVE MV Area (PHT): 4.58 cm       TR Peak grad:   20.8 mmHg MV Area VTI:   2.20 cm       TR Vmax:        228.00 cm/s MV Peak grad:  5.3 mmHg MV Mean grad:  2.0 mmHg       SHUNTS MV Vmax:       1.15 m/s       Systemic VTI:  0.13 m MV Vmean:      62.2 cm/s      Systemic Diam: 2.30 cm MR Peak grad:    98.0 mmHg MR Mean grad:    71.0 mmHg MR Vmax:         495.00 cm/s MR Vmean:        404.0 cm/s MR PISA:         1.57 cm MR PISA Eff ROA: 10 mm MR PISA Radius:  0.50 cm MV E velocity: 111.67 cm/s Skeet Latch MD Electronically signed by Skeet Latch MD Signature  Date/Time: 08/18/2021/11:23:03 AM    Final      PHYSICAL EXAM  Temp:  [97.9 F (36.6 C)-98.2 F (36.8 C)] 98.2 F (36.8 C) (05/31 1152) Pulse Rate:  [71-92] 77 (05/31 1200) Resp:  [14-30] 19 (05/31 1200) BP: (119-172)/(67-112) 129/75 (05/31 1200) SpO2:  [92 %-100 %] 94 % (05/31 1200) Weight:  [56.2 kg] 56.2 kg (05/31 0124)  General - Well nourished, well developed, in no apparent distress.  Ophthalmologic - fundi not visualized due to noncooperation.  Cardiovascular - irregularly irregular heart rate and rhythm.  Mental Status -  Level of arousal and orientation to age, place, and person were intact, not correct for time. Language exam showed initially partial expressive aphasia with word finding difficulty, however, at the end of study, pt seems to have more fluent language. Mild to moderate dysarthria.  Fund of Knowledge was assessed and was intact.  Cranial Nerves II - XII - II - Visual field intact OU. III, IV, VI - Extraocular movements intact. V - Facial sensation intact bilaterally. VII - mild right facial droop VIII - Hearing & vestibular intact bilaterally. X - Palate elevates symmetrically XI - Chin turning & shoulder shrug intact bilaterally. XII - Tongue protrusion  intact.  Motor Strength - The patient's strength was normal in BLEs and LUE, however, RUE proximal 3-/5, distal 4/5. RUE significant ecchymosis with tender to touch.  Bulk was normal and fasciculations were absent.   Motor Tone - Muscle tone was assessed at the neck and appendages and was normal.  Reflexes - The patient's reflexes were symmetrical in all extremities and he had no pathological reflexes.  Sensory - Light touch, temperature/pinprick were assessed and were symmetrical.    Coordination - The patient had normal movements in the left hand with no ataxia or dysmetria. RUE FTN slow but seems intact. Tremor was absent.  Gait and Station - deferred.   ASSESSMENT/PLAN Robert Webb is a 87 y.o. male with no significant past medical history admitted for right arm weakness, right hemianopia and facial droop. TPA given at [facility].  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 Patient counseled to be compliant with his antithrombotic medications Ongoing aggressive stroke risk factor management Therapy recommendations: Pending Disposition: Pending  Afib Present in ED and now in ICU New diagnosis Rate controlled We will consider anticoagulation if MRI no bleeding at 24 hours post tPA.  Hypertension stable BP <180/105 post tPA Long term BP goal normotensive  Hyperlipidemia Home meds: None LDL 78, goal < 70 Now on Crestor 10, no high intensity given LDL close to goal Continue statin at discharge  Other Stroke Risk Factors Advanced age  Other Active Problems None  Hospital day # 0  This patient is critically ill due to stroke status post  tPA, new diagnosed A-fib and at significant risk of neurological worsening, death form recurrent stroke, hemorrhagic transformation, bleeding from tPA, heart failure. This patient's care requires constant monitoring of vital signs, hemodynamics, respiratory and cardiac monitoring, review of multiple databases, neurological assessment, discussion with family, other specialists and medical decision making of high complexity. I spent 40 minutes of neurocritical care time in the care  of this patient. I had long discussion with wife and daughter at bedside, updated pt current condition, treatment plan and potential prognosis, and answered all the questions.  They expressed understanding and appreciation.    Rosalin Hawking, MD PhD Stroke Neurology 08/18/2021 12:18 PM    To contact Stroke Continuity provider, please refer to [email or web address] After hours, contact General Neurology

## 2021-08-18 NOTE — H&P (Signed)
Neurology H&P  CC: Right arm weakness and difficulty talking  History is obtained from: Patient and review of [facility] records  HPI: Robert Webb is a 87 y.o. male with no significant past medical history, does go to yearly medical checkups, who presented to [facility] with acute onset right arm pain and weakness as well as facial droop and right hemianopia.  He was evaluated by tele neurology and administered tPA at 1910.  Subsequently CTA demonstrated an left M2 proximal occlusion which per report to me was discussed with Dr. Karenann Cai, with decision made not to intervene given relatively improving symptoms post tPA. I accepted him for transfer to [facility] for close neurological monitoring and further stroke work-up  On arrival to [facility] he was found to be in atrial fibrillation, which he denies any known history of.  He denies any symptoms including any significant dizziness although he notes dizziness runs in his family (possibly Mnire's disease).  He reports he takes no medications and has a mild allergy (intolerance) to Demerol.  He does not smoke or drink.  He has had no other transient neurological symptoms or signs or symptoms of infection or signs or symptoms of bleeding.  He has not to his knowledge injured his right arm, although he did develop a large bruise which he reports occurred while he was at [facility], and he notes the pain in that arm is improving at this time  45 minutes prior to arrival at [facility] tPA given?:  Yes, at 1910 on 5/31 IA performed?: No Premorbid modified rankin scale:      0 - No symptoms.  ROS: All other review of systems was negative except as noted in the HPI.   No past medical history on file. No significant past medical history  No family history on file. Reports a family history of "dizziness"  Social History:  has no history on file for tobacco use, alcohol use, and drug use. See  HPI  Exam: Current vital signs: BP (!) 154/112   Pulse 90   Temp 97.9 F (36.6 C) (Oral)   Resp (!) 21   Ht '5\' 7"'$  (1.702 m)   Wt 56.2 kg   SpO2 99%   BMI 19.41 kg/m  Vital signs in last 24 hours: Temp:  [97.9 F (36.6 C)] 97.9 F (36.6 C) (05/31 0124) Pulse Rate:  [90] 90 (05/31 0124) Resp:  [21] 21 (05/31 0124) BP: (140-172)/(89-112) 154/112 (05/31 0342) SpO2:  [99 %] 99 % (05/31 0124) Weight:  [56.2 kg] 56.2 kg (05/31 0124)   Physical Exam  Constitutional: Appears thin but well-developed and well-nourished.  Psych: Affect appropriate to situation, pleasant and cooperative Eyes: No scleral injection HENT: No oropharyngeal obstruction.  MSK: no joint deformities.  Cardiovascular: Irregularly irregular.  Bilateral radial pulses intact Respiratory: Effort normal, non-labored breathing GI: Soft.  No distension. There is no tenderness.  Skin: Large hematoma on the right upper extremity  Neuro: Mental Status: Patient is awake, alert, oriented to person, place, month, and situation, but cannot state the year. Patient is able to give a clear and coherent history for the most part, although at times he has significant word finding difficulties and intermittent garbled speech Cranial Nerves: II: Visual Fields are full. Pupils are equal, round, and reactive to light.  III,IV, VI: EOMI with mild bilateral ptosis V: Facial sensation is symmetric to light touch  VII: Facial movement is notable for right facial droop VIII: hearing  is intact to voice X: Uvula elevates symmetrically XI: Shoulder shrug is symmetric. XII: tongue is midline without atrophy or fasciculations.  Motor: Tone is normal. Bulk is normal. 5/5 strength was present in the left upper, right lower, and left lower extremity; deferred testing of the right upper extremity secondary to hematoma and risk of hematoma expansion.  He did have significant weakness in the right upper extremity and could not maintain it  antigravity, with some pain limitation to the movement as well Sensory: Sensation is symmetric to light touch in the arms and legs Deep Tendon Reflexes: 2+ and symmetric in the brachioradialis and patellae.  Plantars: Toes are downgoing bilaterally.  Cerebellar: FNF and HKS are intact bilaterally  Gait:  Deferred for patient safety  NIHSS total 6 Score breakdown: One-point for being unable to state his age, one-point for facial droop, 2 points for right upper extremity weakness, 2 points for mild to moderate aphasia, one-point for mild dysarthria  Note, per nursing the difficulty stating his age is intermittent Performed at 3:30 AM  NIH reported to me at [facility] was 5   I have reviewed labs in epic and the results pertinent to this consultation are: Hemoglobin 13.6, white blood cell count 7.3, platelets 223 CMP notable for creatinine of 0.9, glucose 179  I am unable to review images from at [place], as there appears to be some technical issue in this regard  Per report, head CT had no acute intracranial process and CTA demonstrated a proximal left M2 branch occlusion  Impression: 87 year old man suffering embolic stroke in the setting of new onset atrial fibrillation  Recommendations: # Left M2 MCA branch stroke - Stroke labs TSH, HgbA1c, fasting lipid panel - MRI brain timed for 24 hours post tPA (approximately 7 PM 5/31 1 hour) - MRA of the brain without contrast  - Frequent neuro checks - Echocardiogram - Hold antiplatelet agents until 24 hours stability scan - SCDs for DVT prophylaxis - Risk factor modification - Telemetry monitoring while in ICU - Blood pressure goal   - Post tPA for 24  hours < 180/105 - PT consult, OT consult, Speech consult, unless patient is back to baseline - Stroke team to follow  # Right upper extremity hematoma s/p tPA -Skin marked -Every 2 hour pulse checks  Lesleigh Noe MD-PhD [facility] [phone] [place], [facility],  Teleneuro    Total critical care time: 40 minutes   Critical care time was exclusive of separately billable procedures and treating other patients.   Critical care was necessary to treat or prevent imminent or life-threatening deterioration.   Critical care was time spent personally by me on the following activities: development of treatment plan with patient and/or surrogate as well as nursing, discussions with consultants/primary team, evaluation of patient's response to treatment, examination of patient, obtaining history from patient or surrogate, ordering and performing treatments and interventions, ordering and review of laboratory studies, ordering and review of radiographic studies, and re-evaluation of patient's condition as needed, as documented above.

## 2021-08-18 NOTE — Evaluation (Signed)
Speech Language Pathology Evaluation Patient Details Name: Robert Webb MRN: [id number] DOB: 06-27-1934 Today's Date: 08/18/2021 Time: 6283-1517 SLP Time Calculation (min) (ACUTE ONLY): 15 min  Problem List:  Patient Active Problem List   Diagnosis Date Noted   Acute ischemic left MCA stroke ([place]) 08/18/2021   Past Medical History: History reviewed. No pertinent past medical history. Past Surgical History: History reviewed. No pertinent surgical history. HPI:  Pt is an 87 y.o. male who presented to [facility] with acute onset right arm pain, weakness, facial droop, and right hemianopia. 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 TPA given and pt transferred to [facility] for further work up. No significant PMH.   Assessment / Plan / Recommendation Clinical Impression  Pt was seen for speech-language-cognition evaluation. He denied any baseline deficits in these areas, but stated that he now "mixes words up" and that his speech is softer and less clear that normal. Pt presented with mild non-fluent aphasia characterized by impairments in receptive and expressive language, but with more noticeable difficulty with verbal expression. Pt was able to follow 2-step commands and respond to most complex yes/no questions with intermittent need for repetition; however, he demonstrated significant difficulty with 3-step commands, as well as paragraph-level auditory and reading comprehension. Pt communicated at the conversational level with intermittent word retrieval difficulty and semantic paraphasias. Mild dysarthria was noted characterized by a hoarse vocal quality and reduction in articulatory precision, and vocal intensity, Skilled SLP services are clinically indicated at this time.    SLP Assessment  SLP Recommendation/Assessment: Patient needs continued Speech Lanaguage Pathology Services SLP Visit Diagnosis: Aphasia (R47.01);Dysarthria and anarthria (R47.1)    Recommendations for  follow up therapy are one component of a multi-disciplinary discharge planning process, led by the attending physician.  Recommendations may be updated based on patient status, additional functional criteria and insurance authorization.    Follow Up Recommendations   (Continued SLP services at level of care recommended by PT/OT)    Assistance Recommended at Discharge  Intermittent Supervision/Assistance  Functional Status Assessment Patient has had a recent decline in their functional status and demonstrates the ability to make significant improvements in function in a reasonable and predictable amount of time.  Frequency and Duration min 2x/week  2 weeks      SLP Evaluation Cognition  Overall Cognitive Status: Difficult to assess Arousal/Alertness: Awake/alert Orientation Level: Oriented to person;Oriented to place Year: 2023 Month: June Day of Week: Incorrect Attention: Focused;Sustained Focused Attention: Appears intact Sustained Attention: Appears intact Memory: Impaired Memory Impairment: Decreased short term memory;Decreased recall of new information (Immediate: 5/5 with repetition x4; delayed: 4/5; with cue: 1/1) Awareness: Appears intact       Comprehension  Auditory Comprehension Overall Auditory Comprehension: Impaired Yes/No Questions: Impaired Basic Immediate Environment Questions:  (5/5) Complex Questions:  (4/5) Paragraph Comprehension (via yes/no questions):  (0/4) Commands: Impaired Two Step Basic Commands:  (3/3) Multistep Basic Commands:  (0/3) Reading Comprehension Reading Status: Impaired Word level: Within functional limits Sentence Level: Within functional limits Paragraph Level: Impaired    Expression Expression Primary Mode of Expression: Verbal Verbal Expression Overall Verbal Expression: Impaired Initiation: No impairment Automatic Speech: Day of week;Month of year (Days: 7/7; months: 10/12) Level of Generative/Spontaneous Verbalization:  Conversation Repetition: No impairment Naming: Impairment Responsive:  (4/5) Confrontation:  (5/5) Convergent:  (Sentence completion: 4/5) Divergent:  (10 in 1 minute) Pragmatics: No impairment   Oral / Motor  Oral Motor/Sensory Function Overall Oral Motor/Sensory Function: Within functional limits Motor Speech Overall  Motor Speech: Impaired Respiration: Within functional limits Phonation: Hoarse;Low vocal intensity Resonance: Within functional limits Articulation: Impaired Level of Impairment: Conversation Intelligibility: Intelligibility reduced Word: 75-100% accurate Phrase: 75-100% accurate Sentence: 75-100% accurate Conversation: 75-100% accurate Motor Planning: Witnin functional limits Motor Speech Errors: Aware;Consistent           Pastor Sgro I. Hardin Negus, [place], [place] Office number [phone] Pager [place] 08/18/2021, 9:38 AM

## 2021-08-18 NOTE — Progress Notes (Signed)
Received patient via ground transport. Patient alert and oriented at time of arrival to unit.  Patient educated on call bell, monitor and role. Please see Epic for complete assessment. Dr. Curly [place] notified of patients arrival to unit. Will continue to assess. NIHSS score of 5 upon admission.

## 2021-08-18 NOTE — Progress Notes (Signed)
PT Cancellation Note  Patient Details Name: LINDBERG ZENON MRN: [id number] DOB: 18-Feb-1935   Cancelled Treatment:    Reason Eval/Treat Not Completed: Active bedrest order. Pt received tPA, orders for bedrest currently. 24 hour mark at 1910. PT will follow up once pt is off bedrest and able to participate in PT evaluation.   Zenaida Niece 08/18/2021, 7:48 AM

## 2021-08-18 NOTE — Progress Notes (Signed)
Speech Language Pathology Treatment: Cognitive-Linquistic  Patient Details Name: Robert Webb MRN: [id number] DOB: [birth year] Today's Date: 08/18/2021 Time: 6644-0347 SLP Time Calculation (min) (ACUTE ONLY): 17 min  Assessment / Plan / Recommendation Clinical Impression  Pt was seen for treatment and he was cooperative during the session. He was educated regarding the nature of dysarthria, and aphasia, and compensatory strategies to improve speech intelligibility. Pt was also educated regarding strategies to improve communication, and reduce the frustration when communicating with his family. Demonstrating good awareness, pt expressed that he intends to ask his wife to explicitly state conversational topics since she typically "struggles" with this and he anticipates that it will now be more difficult for him with his auditory comprehension impairments. He used compensatory strategies for speech intelligibility at the sentence level with 100% accuracy. At the paragraph level he demonstrated 70% accuracy with intermittent cues for overarticulation and vocal intensity. SLP will continue to follow pt.    HPI HPI: Pt is an 87 y.o. male who presented to [facility] with acute onset right arm pain, weakness, facial droop, and right hemianopia. 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 TPA given and pt transferred to [facility] for further work up. No significant PMH.      SLP Plan  Continue with current plan of care  Patient needs continued Speech Lanaguage Pathology Services   Recommendations for follow up therapy are one component of a multi-disciplinary discharge planning process, led by the attending physician.  Recommendations may be updated based on patient status, additional functional criteria and insurance authorization.    Recommendations                   Oral Care Recommendations: Oral care BID Follow Up Recommendations:  (Continued SLP services at level of care  recommended by PT/OT) Assistance recommended at discharge: Intermittent Supervision/Assistance SLP Visit Diagnosis: Aphasia (R47.01);Dysarthria and anarthria (R47.1) Plan: Continue with current plan of care         Robert Webb I. Hardin Negus, [place], [place] Office number [phone] Pager [place]  08/18/2021, 9:48 AM

## 2021-08-18 NOTE — Progress Notes (Signed)
OT Cancellation Note  Patient Details Name: DEUNTA BENEKE MRN: [id number] DOB: November 23, 1934   Cancelled Treatment:    Reason Eval/Treat Not Completed: Active bedrest order. Pt received tPA, orders for bedrest currently. 24 hour mark at 1910. OT will follow up once pt is off bedrest and able to participate in evaluation.  [place] 08/18/2021, 7:51 AM  Jesse Sans OTR/L Acute Rehabilitation Services Pager: [phone] Office: [phone]

## 2021-08-18 NOTE — Progress Notes (Signed)
   08/18/21 1730  Clinical Encounter Type  Visited With Family  Visit Type Initial;Spiritual support  Referral From Chaplain  Consult/Referral To [place] visited with family in lobby at the request of day chaplain. Patient was receiving care.  The patient's spouse was in good spirits surrounded by friends and family. She said they were waiting on the patient, Goodwin, to return and visit with him a while longer. I wished them a peaceful evening and advised her if she wanted to talk to a chaplain to let the nurse on duty know.   Danice Goltz [facility]  [phone]

## 2021-08-18 NOTE — Plan of Care (Signed)
Patient with intermittent expressive aphasia and mild dysarthria.  Able to lift RUE to 3/5 painful due to large brusing noted.  NIH on arrival 5 Problem: Education: Goal: Knowledge of General Education information will improve Description: Including pain rating scale, medication(s)/side effects and non-pharmacologic comfort measures Outcome: Progressing   Problem: Health Behavior/Discharge Planning: Goal: Ability to manage health-related needs will improve Outcome: Progressing   Problem: Clinical Measurements: Goal: Ability to maintain clinical measurements within normal limits will improve Outcome: Progressing Goal: Will remain free from infection Outcome: Progressing Goal: Diagnostic test results will improve Outcome: Progressing Goal: Respiratory complications will improve Outcome: Progressing Goal: Cardiovascular complication will be avoided Outcome: Progressing   Problem: Activity: Goal: Risk for activity intolerance will decrease Outcome: Not Progressing   Problem: Nutrition: Goal: Adequate nutrition will be maintained Outcome: Progressing   Problem: Coping: Goal: Level of anxiety will decrease Outcome: Progressing   Problem: Elimination: Goal: Will not experience complications related to bowel motility Outcome: Progressing Goal: Will not experience complications related to urinary retention Outcome: Progressing   Problem: Pain Managment: Goal: General experience of comfort will improve Outcome: Progressing   Problem: Safety: Goal: Ability to remain free from injury will improve Outcome: Progressing   Problem: Skin Integrity: Goal: Risk for impaired skin integrity will decrease Outcome: Progressing   Problem: Coping: Goal: Will verbalize positive feelings about self Outcome: Progressing Goal: Will identify appropriate support needs Outcome: Progressing   Problem: Health Behavior/Discharge Planning: Goal: Ability to manage health-related needs will  improve Outcome: Progressing   Problem: Self-Care: Goal: Ability to participate in self-care as condition permits will improve Outcome: Progressing Goal: Verbalization of feelings and concerns over difficulty with self-care will improve Outcome: Progressing Goal: Ability to communicate needs accurately will improve Outcome: Progressing   Problem: Nutrition: Goal: Risk of aspiration will decrease Outcome: Progressing Goal: Dietary intake will improve Outcome: Progressing   Problem: Ischemic Stroke/TIA Tissue Perfusion: Goal: Complications of ischemic stroke/TIA will be minimized Outcome: Progressing

## 2021-08-19 DIAGNOSIS — I482 Chronic atrial fibrillation, unspecified: Secondary | ICD-10-CM

## 2021-08-19 DIAGNOSIS — E7800 Pure hypercholesterolemia, unspecified: Secondary | ICD-10-CM

## 2021-08-19 LAB — BASIC METABOLIC PANEL
Anion gap: 7 (ref 5–15)
BUN: 14 mg/dL (ref 8–23)
CO2: 22 mmol/L (ref 22–32)
Calcium: 8.7 mg/dL — ABNORMAL LOW (ref 8.9–10.3)
Chloride: 111 mmol/L (ref 98–111)
Creatinine, Ser: 0.93 mg/dL (ref 0.61–1.24)
GFR, Estimated: >60 mL/min (ref >60)
Glucose, Bld: 119 mg/dL — ABNORMAL HIGH (ref 70–99)
Potassium: 4.1 mmol/L (ref 3.5–5.1)
Sodium: 140 mmol/L (ref 135–145)

## 2021-08-19 LAB — CBC
HCT: 37.9 % — ABNORMAL LOW (ref 39.0–52.0)
Hemoglobin: 13 g/dL (ref 13.0–17.0)
MCH: 32.4 pg (ref 26.0–34.0)
MCHC: 34.3 g/dL (ref 30.0–36.0)
MCV: 94.5 fL (ref 80.0–100.0)
Platelets: 212 10*3/uL (ref 150–400)
RBC: 4.01 MIL/uL — ABNORMAL LOW (ref 4.22–5.81)
RDW: 13.2 % (ref 11.5–15.5)
WBC: 10.3 10*3/uL (ref 4.0–10.5)
nRBC: 0 % (ref 0.0–0.2)

## 2021-08-19 MED ORDER — ASPIRIN 325 MG PO TBEC
325.0000 mg | DELAYED_RELEASE_TABLET | Freq: Once | ORAL | Status: AC
  Administered 2021-08-19: 325 mg via ORAL
  Filled 2021-08-19: qty 1

## 2021-08-19 MED ORDER — METOPROLOL TARTRATE 25 MG PO TABS: 12.5000 mg | ORAL_TABLET | Freq: Two times a day (BID) | ORAL | Status: DC

## 2021-08-19 MED ORDER — ENOXAPARIN SODIUM 40 MG/0.4ML IJ SOSY: 40.0000 mg | PREFILLED_SYRINGE | INTRAMUSCULAR | Status: DC

## 2021-08-19 MED ORDER — APIXABAN 2.5 MG PO TABS
2.5000 mg | ORAL_TABLET | Freq: Two times a day (BID) | ORAL | Status: DC
  Administered 2021-08-19 – 2021-08-20 (×3): 2.5 mg via ORAL
  Filled 2021-08-19 (×4): qty 1

## 2021-08-19 NOTE — Progress Notes (Signed)
ANTICOAGULATION CONSULT NOTE - Initial Consult  Pharmacy Consult for apixaban Indication: atrial fibrillation  Allergies  Allergen Reactions   Demerol [Meperidine Hcl] Nausea And Vomiting    Patient Measurements: Height: '5\' 7"'$  (170.2 cm) Weight: 56.2 kg (123 lb 14.4 oz) IBW/kg (Calculated) : 66.1  Vital Signs: Temp: 98.4 F (36.9 C) (06/01 0400) Temp Source: Oral (06/01 0400) BP: 121/65 (06/01 0800) Pulse Rate: 99 (06/01 0800)  Labs: Recent Labs    08/19/21 0548  HGB 13.0  HCT 37.9*  PLT 212  CREATININE 0.93    Estimated Creatinine Clearance: 46.2 mL/min (by C-G formula based on SCr of 0.93 mg/dL).   Medical History: History reviewed. No pertinent past medical history.  Assessment: 87 yo with M2 thrombus and occlusion. S/p TPA at [facility].  Patient with afib. Pharmacy consulted to start apixaban.   Patient meets 2 of 3 criteria for reduced dose.    Plan:  Start apixaban 2.5 mg po bid Monitor for signs and symptoms of bleeding  Alanda Slim, PharmD, [facility] Clinical Pharmacist Please see AMION for all Pharmacists' Contact Phone Numbers 08/19/2021, 8:49 AM

## 2021-08-19 NOTE — Progress Notes (Addendum)
STROKE TEAM PROGRESS NOTE   SUBJECTIVE (INTERVAL HISTORY) His wife and daughter are at the bedside. Pt still has mild expressive aphasia and right arm weakness more proximal, partially due to RUE pain from extensive ecchymosis and soft tissue hematoma, pain is improving. Still in afib, rate controlled, asymptomatic. Start eliquis today.   OBJECTIVE Temp:  [98.1 F (36.7 C)-98.7 F (37.1 C)] 98.7 F (37.1 C) (06/01 0800) Pulse Rate:  [56-119] 89 (06/01 1000) Cardiac Rhythm: Atrial fibrillation (06/01 0800) Resp:  [12-31] 22 (06/01 1000) BP: (98-168)/(56-114) 151/81 (06/01 0900) SpO2:  [93 %-98 %] 95 % (06/01 1000)  No results for input(s): GLUCAP in the last 168 hours. Recent Labs  Lab 08/19/21 0548  NA 140  K 4.1  CL 111  CO2 22  GLUCOSE 119*  BUN 14  CREATININE 0.93  CALCIUM 8.7*   No results for input(s): AST, ALT, ALKPHOS, BILITOT, PROT, ALBUMIN in the last 168 hours. Recent Labs  Lab 08/19/21 0548  WBC 10.3  HGB 13.0  HCT 37.9*  MCV 94.5  PLT 212   No results for input(s): CKTOTAL, CKMB, CKMBINDEX, TROPONINI in the last 168 hours. No results for input(s): LABPROT, INR in the last 72 hours. No results for input(s): COLORURINE, LABSPEC, [place], GLUCOSEU, HGBUR, BILIRUBINUR, KETONESUR, PROTEINUR, UROBILINOGEN, NITRITE, LEUKOCYTESUR in the last 72 hours.  Invalid input(s): APPERANCEUR     Component Value Date/Time   CHOL 132 08/18/2021 0421   TRIG 20 08/18/2021 0421   HDL 50 08/18/2021 0421   CHOLHDL 2.6 08/18/2021 0421   VLDL 4 08/18/2021 0421   LDLCALC 78 08/18/2021 0421   Lab Results  Component Value Date   HGBA1C 5.5 08/18/2021   No results found for: LABOPIA, COCAINSCRNUR, LABBENZ, AMPHETMU, THCU, LABBARB  No results for input(s): ETH in the last 168 hours.  I have personally reviewed the radiological images below and agree with the radiology interpretations.  MR ANGIO HEAD WO CONTRAST  Result Date: 08/19/2021 CLINICAL DATA:  Follow-up examination  for acute stroke. EXAM: MRI HEAD WITHOUT CONTRAST MRA HEAD WITHOUT CONTRAST TECHNIQUE: Multiplanar, multi-echo pulse sequences of the brain and surrounding structures were acquired without intravenous contrast. Angiographic images of the Circle of Willis were acquired using MRA technique without intravenous contrast. COMPARISON:  Prior CT from 08/17/2021. FINDINGS: MRI HEAD FINDINGS Brain: Examination degraded by motion artifact. Generalized age-related cerebral atrophy. Patchy and confluent T2/FLAIR hyperintensity involving the periventricular and deep white matter both cerebral hemispheres as well as the pons, most consistent with chronic small vessel ischemic disease, moderately advanced in nature. Patchy small volume restricted diffusion seen involving the left insular/subinsular region, within additional patchy cortical and subcortical involvement of the overlying left frontal lobe, consistent with acute left MCA distribution infarct. No associated hemorrhage or significant mass effect. No other evidence for acute or subacute ischemia. Gray-white matter differentiation otherwise maintained. No visible acute or chronic intracranial blood products. No mass lesion, midline shift or mass effect. No hydrocephalus or extra-axial fluid collection. Pituitary gland suprasellar region within normal limits. Vascular: Susceptibility artifacts seen involving the proximal left MCA at the base of the left sylvian fissure, likely reflecting thrombus (series 8, image 38, 45). Major intracranial vascular flow voids are otherwise maintained. Skull and upper cervical spine: Basal junction within normal limits. Bone marrow signal intensity grossly normal. No scalp soft tissue abnormality. Sinuses/Orbits: Prior bilateral ocular lens replacement. Scattered mucosal thickening noted throughout the ethmoidal air cells. No significant mastoid effusion. Other: None. MRA HEAD FINDINGS Anterior circulation: Examination moderately  degraded  by motion artifact. Distal cervical segments of the internal carotid arteries are patent with antegrade flow. Both internal carotid arteries remain widely patent to the termini. Note again made of an approximate 2 mm focal outpouching extending laterally from the cavernous left ICA (series 3, image 67), which could reflect a small aneurysm versus vascular infundibulum. Additional small funnel shaped outpouching arising from the contralateral right carotid siphon consistent with a vascular infundibulum. A1 segments patent with short-segment fenestration on the left. Normal anterior communicating complex. Anterior cerebral arteries remain widely patent. Right MCA and its branch vessels are widely patent. On the left, the left M1 segment remains widely patent. There is persistent occlusive thrombus within a proximal left M2 branch just distal to the bifurcation. This corresponds with susceptibility artifacts seen on brain MRI, and is relatively stable from prior CTA. Remainder of the left MCA branches remain patent and perfused. Posterior circulation: Both V4 segments patent to the vertebrobasilar junction. Both PICA patent proximally. Basilar patent to its distal aspect without stenosis. Superior cerebellar arteries patent bilaterally. Left PCA primarily supplied via the basilar. Predominant fetal type origin of the right PCA. PCAs remain widely patent to their distal aspects. Anatomic variants: None significant. IMPRESSION: MRI HEAD IMPRESSION: 1. Patchy small volume acute ischemic infarcts involving the left insular/subinsular region as well as the overlying left frontal lobe. No associated hemorrhage or significant mass effect. 2. Underlying age-related cerebral atrophy with moderate chronic microvascular ischemic disease. MRA HEAD IMPRESSION: 1. Persistent occlusive thrombus within a proximal left M2 branch just distal to the bifurcation. Finding is relatively stable as compared to prior CTA. 2. Otherwise wide  patency of the intracranial arterial circulation. No hemodynamically significant or correctable stenosis. 3. 2 mm focal outpouching extending laterally from the cavernous left ICA, which could reflect a small aneurysm versus vascular infundibulum. Attention at follow-up recommended. Electronically Signed   By: Jeannine Boga M.D.   On: 08/19/2021 02:10   MR BRAIN WO CONTRAST  Result Date: 08/19/2021 CLINICAL DATA:  Follow-up examination for acute stroke. EXAM: MRI HEAD WITHOUT CONTRAST MRA HEAD WITHOUT CONTRAST TECHNIQUE: Multiplanar, multi-echo pulse sequences of the brain and surrounding structures were acquired without intravenous contrast. Angiographic images of the Circle of Willis were acquired using MRA technique without intravenous contrast. COMPARISON:  Prior CT from 08/17/2021. FINDINGS: MRI HEAD FINDINGS Brain: Examination degraded by motion artifact. Generalized age-related cerebral atrophy. Patchy and confluent T2/FLAIR hyperintensity involving the periventricular and deep white matter both cerebral hemispheres as well as the pons, most consistent with chronic small vessel ischemic disease, moderately advanced in nature. Patchy small volume restricted diffusion seen involving the left insular/subinsular region, within additional patchy cortical and subcortical involvement of the overlying left frontal lobe, consistent with acute left MCA distribution infarct. No associated hemorrhage or significant mass effect. No other evidence for acute or subacute ischemia. Gray-white matter differentiation otherwise maintained. No visible acute or chronic intracranial blood products. No mass lesion, midline shift or mass effect. No hydrocephalus or extra-axial fluid collection. Pituitary gland suprasellar region within normal limits. Vascular: Susceptibility artifacts seen involving the proximal left MCA at the base of the left sylvian fissure, likely reflecting thrombus (series 8, image 38, 45). Major  intracranial vascular flow voids are otherwise maintained. Skull and upper cervical spine: Basal junction within normal limits. Bone marrow signal intensity grossly normal. No scalp soft tissue abnormality. Sinuses/Orbits: Prior bilateral ocular lens replacement. Scattered mucosal thickening noted throughout the ethmoidal air cells. No significant mastoid effusion. Other: None. MRA  HEAD FINDINGS Anterior circulation: Examination moderately degraded by motion artifact. Distal cervical segments of the internal carotid arteries are patent with antegrade flow. Both internal carotid arteries remain widely patent to the termini. Note again made of an approximate 2 mm focal outpouching extending laterally from the cavernous left ICA (series 3, image 67), which could reflect a small aneurysm versus vascular infundibulum. Additional small funnel shaped outpouching arising from the contralateral right carotid siphon consistent with a vascular infundibulum. A1 segments patent with short-segment fenestration on the left. Normal anterior communicating complex. Anterior cerebral arteries remain widely patent. Right MCA and its branch vessels are widely patent. On the left, the left M1 segment remains widely patent. There is persistent occlusive thrombus within a proximal left M2 branch just distal to the bifurcation. This corresponds with susceptibility artifacts seen on brain MRI, and is relatively stable from prior CTA. Remainder of the left MCA branches remain patent and perfused. Posterior circulation: Both V4 segments patent to the vertebrobasilar junction. Both PICA patent proximally. Basilar patent to its distal aspect without stenosis. Superior cerebellar arteries patent bilaterally. Left PCA primarily supplied via the basilar. Predominant fetal type origin of the right PCA. PCAs remain widely patent to their distal aspects. Anatomic variants: None significant. IMPRESSION: MRI HEAD IMPRESSION: 1. Patchy small volume acute  ischemic infarcts involving the left insular/subinsular region as well as the overlying left frontal lobe. No associated hemorrhage or significant mass effect. 2. Underlying age-related cerebral atrophy with moderate chronic microvascular ischemic disease. MRA HEAD IMPRESSION: 1. Persistent occlusive thrombus within a proximal left M2 branch just distal to the bifurcation. Finding is relatively stable as compared to prior CTA. 2. Otherwise wide patency of the intracranial arterial circulation. No hemodynamically significant or correctable stenosis. 3. 2 mm focal outpouching extending laterally from the cavernous left ICA, which could reflect a small aneurysm versus vascular infundibulum. Attention at follow-up recommended. Electronically Signed   By: Jeannine Boga M.D.   On: 08/19/2021 02:10   ECHOCARDIOGRAM COMPLETE  Result Date: 08/18/2021    ECHOCARDIOGRAM REPORT   Patient Name:   Robert Webb Date of Exam: 08/18/2021 Medical Rec #:  [id number]       Height:       67.0 in Accession #:    [id number]      Weight:       123.9 lb Date of Birth:  1934/10/21       BSA:          1.650 m Patient Age:    69 years        BP:           143/82 mmHg Patient Gender: M               HR:           70 bpm. Exam Location:  Inpatient Procedure: 2D Echo, Cardiac Doppler and Color Doppler Indications:    CVA  History:        Patient has no prior history of Echocardiogram examinations.  Sonographer:    Luisa Hart RDCS Referring Phys: [id number] [place]  1. Left ventricular ejection fraction, by estimation, is 45 to 50%. The left ventricle has mildly decreased function. The left ventricle demonstrates regional wall motion abnormalities (see scoring diagram/findings for description). There is severe concentric left ventricular hypertrophy. Left ventricular diastolic function could not be evaluated.  2. Right ventricular systolic function is normal. The right ventricular size is normal. Moderately  increased right ventricular wall  thickness. There is normal pulmonary artery systolic pressure.  3. Left atrial size was severely dilated.  4. Right atrial size was severely dilated.  5. The mitral valve is normal in structure. Trivial mitral valve regurgitation. No evidence of mitral stenosis.  6. The aortic valve is tricuspid. Aortic valve regurgitation is trivial. No aortic stenosis is present.  7. Aortic dilatation noted. There is mild dilatation of the ascending aorta, measuring 37 mm. There is mild dilatation of the aortic root, measuring 40 mm.  8. The inferior vena cava is normal in size with <50% respiratory variability, suggesting right atrial pressure of 8 mmHg. FINDINGS  Left Ventricle: Left ventricular ejection fraction, by estimation, is 45 to 50%. The left ventricle has mildly decreased function. The left ventricle demonstrates regional wall motion abnormalities. The left ventricular internal cavity size was normal in size. There is severe concentric left ventricular hypertrophy. Left ventricular diastolic function could not be evaluated due to atrial fibrillation. Left ventricular diastolic function could not be evaluated.  LV Wall Scoring: The antero-lateral wall, basal anteroseptal segment, basal inferolateral segment, basal anterior segment, basal inferior segment, and basal inferoseptal segment are hypokinetic. The mid and distal anterior wall, mid and distal lateral wall, mid and distal anterior septum, entire apex, mid and distal inferior wall, and mid inferoseptal segment are normal. Basal hypokinesis with sparing of the mid to apical segments. Reverse Takasubo physiology. Right Ventricle: The right ventricular size is normal. Moderately increased right ventricular wall thickness. Right ventricular systolic function is normal. There is normal pulmonary artery systolic pressure. The tricuspid regurgitant velocity is 2.28 m/s, and with an assumed right atrial pressure of 8 mmHg, the estimated  right ventricular systolic pressure is 50.0 mmHg. Left Atrium: Left atrial size was severely dilated. Right Atrium: Right atrial size was severely dilated. Pericardium: Trivial pericardial effusion is present. Mitral Valve: The mitral valve is normal in structure. Trivial mitral valve regurgitation. No evidence of mitral valve stenosis. MV peak gradient, 5.3 mmHg. The mean mitral valve gradient is 2.0 mmHg. Tricuspid Valve: The tricuspid valve is normal in structure. Tricuspid valve regurgitation is trivial. No evidence of tricuspid stenosis. Aortic Valve: The aortic valve is tricuspid. Aortic valve regurgitation is trivial. No aortic stenosis is present. Aortic valve mean gradient measures 3.6 mmHg. Aortic valve peak gradient measures 6.9 mmHg. Aortic valve area, by VTI measures 2.44 cm. Pulmonic Valve: The pulmonic valve was normal in structure. Pulmonic valve regurgitation is mild. No evidence of pulmonic stenosis. Aorta: Aortic dilatation noted. There is mild dilatation of the ascending aorta, measuring 37 mm. There is mild dilatation of the aortic root, measuring 40 mm. Venous: The inferior vena cava is normal in size with less than 50% respiratory variability, suggesting right atrial pressure of 8 mmHg. IAS/Shunts: No atrial level shunt detected by color flow Doppler. Additional Comments: Biventricular hypertrophy, severe atrial enlargement and pericardial effusion. Consider evaluation for amyloidosis or other infiltrative cardiomyopathies.  LEFT VENTRICLE PLAX 2D LVIDd:         4.20 cm LVIDs:         3.40 cm LV PW:         1.90 cm LV IVS:        1.80 cm LVOT diam:     2.30 cm LV SV:         55 LV SV Index:   33 LVOT Area:     4.15 cm  LV Volumes (MOD) LV vol d, MOD A2C: 61.4 ml LV vol d, MOD A4C:  58.3 ml LV vol s, MOD A2C: 37.2 ml LV vol s, MOD A4C: 33.2 ml LV SV MOD A2C:     24.2 ml LV SV MOD A4C:     58.3 ml LV SV MOD BP:      25.3 ml RIGHT VENTRICLE RV Basal diam:  3.70 cm RV Mid diam:    1.20 cm RV S  prime:     13.70 cm/s TAPSE (M-mode): 1.7 cm LEFT ATRIUM              Index        RIGHT ATRIUM           Index LA diam:        4.30 cm  2.61 cm/m   RA Area:     23.10 cm LA Vol (A2C):   125.0 ml 75.76 ml/m  RA Volume:   75.60 ml  45.82 ml/m LA Vol (A4C):   86.2 ml  52.25 ml/m LA Biplane Vol: 105.0 ml 63.64 ml/m  AORTIC VALVE                    PULMONIC VALVE AV Area (Vmax):    2.46 cm     PV Vmax:          0.95 m/s AV Area (Vmean):   2.38 cm     PV Vmean:         62.850 cm/s AV Area (VTI):     2.44 cm     PV VTI:           0.175 m AV Vmax:           131.80 cm/s  PV Peak grad:     3.6 mmHg AV Vmean:          86.880 cm/s  PV Mean grad:     2.0 mmHg AV VTI:            0.224 m      PR End Diast Vel: 8.41 msec AV Peak Grad:      6.9 mmHg AV Mean Grad:      3.6 mmHg LVOT Vmax:         77.95 cm/s LVOT Vmean:        49.775 cm/s LVOT VTI:          0.132 m LVOT/AV VTI ratio: 0.59  AORTA Ao Root diam: 4.00 cm Ao Asc diam:  3.70 cm MITRAL VALVE                  TRICUSPID VALVE MV Area (PHT): 4.58 cm       TR Peak grad:   20.8 mmHg MV Area VTI:   2.20 cm       TR Vmax:        228.00 cm/s MV Peak grad:  5.3 mmHg MV Mean grad:  2.0 mmHg       SHUNTS MV Vmax:       1.15 m/s       Systemic VTI:  0.13 m MV Vmean:      62.2 cm/s      Systemic Diam: 2.30 cm MR Peak grad:    98.0 mmHg MR Mean grad:    71.0 mmHg MR Vmax:         495.00 cm/s MR Vmean:        404.0 cm/s MR PISA:         1.57 cm MR PISA Eff ROA: 10 mm MR PISA Radius:  0.50 cm MV E velocity: 111.67 cm/s Skeet Latch MD Electronically signed by Skeet Latch MD Signature Date/Time: 08/18/2021/11:23:03 AM    Final      PHYSICAL EXAM  Temp:  [98.1 F (36.7 C)-98.7 F (37.1 C)] 98.7 F (37.1 C) (06/01 0800) Pulse Rate:  [56-119] 89 (06/01 1000) Resp:  [12-31] 22 (06/01 1000) BP: (98-168)/(56-114) 151/81 (06/01 0900) SpO2:  [93 %-98 %] 95 % (06/01 1000)  General - Well nourished, well developed, in no apparent distress.  Ophthalmologic - fundi  not visualized due to noncooperation.  Cardiovascular - irregularly irregular heart rate and rhythm.  Mental Status -  Level of arousal and orientation to age, place, and person were intact, not correct for time. Language exam showed initially partial expressive aphasia with word finding difficulty, however, at the end of study, pt seems to have more fluent language, seems to be improving. Mild to moderate dysarthria.  Fund of Knowledge was assessed and was intact.  Cranial Nerves II - XII - II - Visual field intact OU. III, IV, VI - Extraocular movements intact. V - Facial sensation intact bilaterally. VII - mild right facial droop VIII - Hearing & vestibular intact bilaterally. X - Palate elevates symmetrically XI - Chin turning & shoulder shrug intact bilaterally. XII - Tongue protrusion intact.  Motor Strength - The patient's strength was normal in BLEs and LUE, however, RUE proximal 3+/5 (mainly due to pain), distal 4+/5. RUE significant ecchymosis with tender to touch.  Bulk was normal and fasciculations were absent.   Motor Tone - Muscle tone was assessed at the neck and appendages and was normal.  Reflexes - The patient's reflexes were symmetrical in all extremities and he had no pathological reflexes.  Sensory - Light touch, temperature/pinprick were assessed and were symmetrical.    Coordination - The patient had normal movements in the left hand with no ataxia or dysmetria. RUE FTN slow but seems intact. Tremor was absent.  Gait and Station - deferred.   ASSESSMENT/PLAN Robert Webb is a 87 y.o. male with no significant past medical history admitted for right arm weakness, right hemianopia and facial droop. TPA given at [facility]. MRI shows patchy small volume acute ischemic infarcts in the left insular/sub insular area and left frontal lobe. Start eliquis 6/1. PT/OT/ST evaluations ordered. Transfer out of ICU today. Plan for discharge 6/2.  Stroke:  left  MCA scattered patchy infarct due to left M2 occlusion status post tPA embolic secondary to new diagnosed A-fib CT no acute abnormality CTA head and neck left M2 thrombus and occlusion MRI- Patchy small volume acute ischemic infarcts involving the left insular/subinsular region as well as the overlying left frontal lobe.  MRA Persistent occlusive thrombus within a proximal left M2 branch just distal to the bifurcation.  2D Echo EF 45 to 50% LDL 78 HgbA1c 5.5 SCDs for VTE prophylaxis No antithrombotic prior to admission, now on eliquis 2.'5mg'$  bid. Continue on discharge Patient counseled to be compliant with his antithrombotic medications Ongoing aggressive stroke risk factor management Therapy recommendations: outpt PT Disposition: Pending  Afib Present in ED and now in ICU New diagnosis Rate controlled Eliquis 2.'5mg'$  BID  Hypertension stable BP <180/105 post tPA Long term BP goal normotensive  Hyperlipidemia Home meds: None LDL 78, goal < 70 Now on Crestor 10, no high intensity given LDL close to goal Continue statin at discharge  Other Stroke Risk Factors Advanced age  Other Active Problems None  Hospital day # 1  Patient seen and examined by NP/APP with MD. MD to update note as needed.   Janine Ores, DNP, FNP-BC [facility] Pager: [phone]  ATTENDING NOTE: I reviewed above note and agree with the assessment and plan. Pt was seen and examined.   Wife and daughter at bedside.  Patient sitting in chair, awake alert, in good spirit.  Still has mild expressive aphasia, right arm weakness more distal than proximal and right arm severe ecchymosis and soft tissue hematoma.  MRI confirmed left MCA small patchy infarcts.  MRA showed persistent occlusion of left M2.  Continue have A-fib and rate controlled, put on Eliquis 2.5 mg twice daily for stroke prevention.  PT started recommend outpatient PT.  For detailed assessment and plan, please refer to above as  I have made changes wherever appropriate.   Rosalin Hawking, MD PhD Stroke Neurology 08/19/2021 5:34 PM  This patient is critically ill due to stroke status post tPA, new diagnosed A-fib and at significant risk of neurological worsening, death form recurrent stroke, hemorrhagic transformation, bleeding from Eliquis, heart failure. This patient's care requires constant monitoring of vital signs, hemodynamics, respiratory and cardiac monitoring, review of multiple databases, neurological assessment, discussion with family, other specialists and medical decision making of high complexity. I spent 35 minutes of neurocritical care time in the care of this patient. I had long discussion with patient, wife and the daughter at bedside, updated pt current condition, treatment plan and potential prognosis, and answered all the questions.  They expressed understanding and appreciation.    To contact Stroke Continuity provider, please refer to [email or web address] After hours, contact General Neurology

## 2021-08-19 NOTE — Progress Notes (Signed)
Occupational Therapy Evaluation Patient Details Name: Robert Webb MRN: [id number] DOB: Jun 01, 1934 Today's Date: 08/19/2021  Clinical Impression: Pt is typically independent in ADL and mobility, still drives, mows the grass, is known for assiting others. Today he presents with aphasia, RUE deficits as well balance deficits. He is min guard for LB ADL at this time from seated position, very strong power up for transfers with 1 LOB requiring min therapist assist to prevent fall. Pt able to stand at sink for multiple grooming tasks. RUE is painful with shoulder ROM due to swelling and bruising and weakness is more proximal - improving in the distal areas as he is able to manipulate small object and open up toothpaste, but RUE requires assist from the LUE to brush hair and bring cups to his mouth for drinking. At this time recommending OPOT as follow up, and continued OT in the acute setting to focus on RUE and balance deficits specifically. Next session establish HEP.     08/19/21 0800  OT Visit Information  Last OT Received On 08/19/21  Assistance Needed +1  History of Present Illness 87 y.o. male presents to [facility] hospital on 5.31.2023 with R arm pain/weakness, facial droop, R hemianopia. CTA demonstrates L M2 occulsion. Pt received tPA. No significant PMH noted.  Precautions  Precautions Fall  Precaution Comments Afib - new  Restrictions  Weight Bearing Restrictions No  Home Living  Family/patient expects to be discharged to: Private residence  Living Arrangements Spouse/significant other  Available Help at Discharge Family;Available 24 hours/day  Type of [place] to enter  Entrance Stairs-Number of Steps 1  Entrance Stairs-Rails None  Home Layout One level  Bathroom Shower/Tub Walk-in shower  Bathroom Toilet Handicapped height  Home Equipment Wheelchair - manual   Lives With Spouse  Prior Function  Prior Level of Function  Independent/Modified Independent   Communication  Communication Expressive difficulties (aphasia)  Pain Assessment  Pain Assessment No/denies pain  Cognition  Arousal/Alertness Awake/alert  Behavior During Therapy WFL for tasks assessed/performed  Overall Cognitive Status Impaired/Different from baseline  Area of Impairment Safety/judgement;Awareness  Safety/Judgement Decreased awareness of safety;Decreased awareness of deficits  Awareness Emergent  General Comments Pt VERY pleasant and cooperative. 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  Difficult to assess due to Impaired communication (aphasia)  Upper Extremity Assessment  Upper Extremity Assessment RUE deficits/detail  RUE Deficits / Details obvious edema and bruising, weakness is proximal>distal most obvious in shoulder flexion/abduction and elbow flexion/exention. grasp 4/5- full flexion/extension of digits, wrist 4/5, elbow 3+/5, RUE is painful with shoulder flexion/abduction  RUE Shoulder pain with ROM  RUE Sensation WNL (throughout - no changes proximal to distal)  RUE Coordination decreased gross motor  Lower Extremity Assessment  Lower Extremity Assessment Defer to PT evaluation  Cervical / Trunk Assessment  Cervical / Trunk Assessment Normal  Vision- History  Ability to See in Adequate Light 0 Adequate  Vision- Assessment  Vision Assessment? Yes  Eye Alignment WFL  Ocular Range of Motion [facility]  Alignment/Gaze Preference WDL  Tracking/Visual Pursuits Able to track stimulus in all quads without difficulty  Convergence WFL  Visual Fields No apparent deficits  Additional Comments Vision tested [facility], Pt denies any blurriness or doble vision at this time  ADL  Overall ADL's  Needs assistance/impaired  Eating/Feeding Set up;Sitting  Eating/Feeding Details (indicate cue type and reason) self-feeding when OT entered, needs some assist with cutting up items due  to need for shoulder elevation with utensils,  uses 2 hands to bring cup to mouth  Grooming Wash/dry hands;Wash/dry face;Oral care;Brushing hair;Moderate assistance;Standing  Grooming Details (indicate cue type and reason) pt able to complete standing at sink. utilizing R hand to perform oral care, needed mod A to complete brushing hair due to shoulder forward flexion and assisted using LUE to hold RUE, encouragement to perform Bil hand squeeze on wash cloth  Upper Body Bathing Min guard;Sitting  Upper Body Bathing Details (indicate cue type and reason) recommend sitting at this time for safety  Lower Body Bathing Min guard;Sitting/lateral leans  Lower Body Bathing Details (indicate cue type and reason) recommend sitting at this time  Upper Body Dressing  Minimal assistance;With caregiver independent assisting;Sitting  Upper Body Dressing Details (indicate cue type and reason) educated to dress RUE first  Lower Body Dressing Min guard;Sit to/from stand;Minimal assistance  Toilet Transfer Minimal assistance;Ambulation  Toilet Transfer Details (indicate cue type and reason) 1 LOB and OT used gait belt to help re-balance  Toileting- Water quality scientist and Hygiene Min guard;Sit to/from stand  Engineer, petroleum  Bed Mobility  General bed mobility comments OOB in recliner at beginning and end of session  Transfers  Overall transfer level Needs assistance  Equipment used None  Transfers Sit to/from Stand  Sit to [employer] transfer comment strong power up, guard for balance  Balance  Overall balance assessment Needs assistance  Sitting-balance support No upper extremity supported;Feet supported  Sitting balance-Leahy Scale Good  Standing balance support No upper extremity supported  Standing balance-Leahy Scale Fair  Standing balance comment 1 LOB during dymanic movement to bathroom requiring therapist assist with gait belt  General Comments  General comments (skin integrity, edema, etc.) HR 103-167  during session, mostly in the low 130's during activity. Daughter and Wife present throughout session.  OT - End of Session  Equipment Utilized During Treatment Gait belt  Activity Tolerance Patient tolerated treatment well  Patient left in chair;with call bell/phone within reach;with nursing/sitter in room  Nurse Communication Mobility status  OT Assessment  OT Recommendation/Assessment Patient needs continued OT Services  OT Visit Diagnosis Unsteadiness on feet (R26.81);Other symptoms and signs involving the nervous system (R29.898);Pain  Pain - Right/Left Right  Pain - part of body Shoulder  OT Problem List Decreased strength;Decreased range of motion;Decreased activity tolerance;Impaired balance (sitting and/or standing);Decreased coordination;Decreased cognition;Decreased safety awareness;Cardiopulmonary status limiting activity;Impaired UE functional use;Pain;Increased edema  OT Plan  OT Frequency (ACUTE ONLY) Min 2X/week  OT Treatment/Interventions (ACUTE ONLY) Self-care/ADL training;Therapeutic exercise;Neuromuscular education;DME and/or AE instruction;Therapeutic activities;Patient/family education;Balance training  AM-PAC OT "6 Clicks" Daily Activity Outcome Measure (Version 2)  Help from another person eating meals? 3  Help from another person taking care of personal grooming? 2  Help from another person toileting, which includes using toliet, bedpan, or urinal? 3  Help from another person bathing (including washing, rinsing, drying)? 3  Help from another person to put on and taking off regular upper body clothing? 3  Help from another person to put on and taking off regular lower body clothing? 3  6 Click Score 17  Progressive Mobility  What is the highest level of mobility based on the progressive mobility assessment? Level 4 (Walks with assist in room) - Balance while marching in place and cannot step forward and back - Complete  Activity Ambulated with assistance to bathroom   OT Recommendation  Recommendations for Other Services PT consult;Speech consult  Follow  Up Recommendations Outpatient OT (Pt and family prefer to close to home)  Assistance recommended at discharge Frequent or constant Supervision/Assistance  Patient can return home with the following A little help with walking and/or transfers;A little help with bathing/dressing/bathroom;Assistance with cooking/housework;Direct supervision/assist for medications management;Direct supervision/assist for financial management;Assist for transportation;Help with stairs or ramp for entrance  Functional Status Assessent Patient has had a recent decline in their functional status and demonstrates the ability to make significant improvements in function in a reasonable and predictable amount of time.  OT Equipment BSC/3in1 (to be used as shower chair)  Individuals Consulted  Consulted and Agree with Results and Recommendations Patient;Family member/caregiver  Family Member Consulted daughter and wife  Acute Rehab OT Goals  Patient Stated Goal get my arm working again to be able to help people  OT Goal Formulation With patient/family  Time For Goal Achievement 09/02/21  Potential to Achieve Goals Good  OT Time Calculation  OT Start Time (ACUTE ONLY) 0844  OT Stop Time (ACUTE ONLY) 0940  OT Time Calculation (min) 56 min  OT General Charges  $OT Visit 1 Visit  OT Evaluation  $OT Eval Moderate Complexity 1 Mod  OT Treatments  $Self Care/Home Management  23-37 mins  $Therapeutic Activity 8-22 mins  Written Expression  Dominant Hand Right   Jesse Sans OTR/L Acute Rehabilitation Services Pager: [phone] Office: [phone]

## 2021-08-19 NOTE — Evaluation (Signed)
Physical Therapy Evaluation Patient Details Name: Robert Webb MRN: [id number] DOB: 1935/03/02 Today's Date: 08/19/2021  History of Present Illness  87 y.o. male presents to [facility] hospital on 5.31.2023 with R arm pain/weakness, facial droop, R hemianopia. CTA demonstrates L M2 occulsion. Pt received tPA. No significant PMH noted.  Clinical Impression  Pt admitted with above diagnosis. Pt  was able to ambulate on unit with min to min guard assist. Wife educated in how to assist pt and she may want pt to have initial RW for gait due to some LOB at times.  Pt shouldd progress well and need Outpt PT f/u.  Will have 24 hour care at home.   Pt currently with functional limitations due to the deficits listed below (see PT Problem List). Pt will benefit from skilled PT to increase their independence and safety with mobility to allow discharge to the venue listed below.          Recommendations for follow up therapy are one component of a multi-disciplinary discharge planning process, led by the attending physician.  Recommendations may be updated based on patient status, additional functional criteria and insurance authorization.  Follow Up Recommendations Outpatient PT    Assistance Recommended at Discharge PRN  Patient can return home with the following  A little help with walking and/or transfers;Help with stairs or ramp for entrance    Equipment Recommendations Rolling walker (2 wheels);BSC/3in1 (Pt unsure if he wants RW yet; issued gait belt)  Recommendations for Other Services       Functional Status Assessment Patient has had a recent decline in their functional status and demonstrates the ability to make significant improvements in function in a reasonable and predictable amount of time.     Precautions / Restrictions Precautions Precautions: Fall Precaution Comments: Afib - new Restrictions Weight Bearing Restrictions: No      Mobility  Bed Mobility               General  bed mobility comments: OOB in recliner at beginning and end of session    Transfers Overall transfer level: Needs assistance Equipment used: None Transfers: Sit to/from Stand Sit to Stand: Min guard           General transfer comment: strong power up, guard for balance    Ambulation/Gait Ambulation/Gait assistance: Min assist, Min guard Gait Distance (Feet): 600 Feet Assistive device: None Gait Pattern/deviations: Step-through pattern, Decreased stride length, Staggering right, Drifts right/left   Gait velocity interpretation: <1.8 ft/sec, indicate of risk for recurrent falls   General Gait Details: Pt occasionally with incr sway to right and needs steadaying assist.  Issued gait belt and had wife walk with pt on second walk to ensure she was comfortable walking the pt. Needed steadying assist with challenges as well and daughter and wife aware.  Stairs            Wheelchair Mobility    Modified Rankin (Stroke Patients Only)       Balance Overall balance assessment: Needs assistance Sitting-balance support: No upper extremity supported, Feet supported Sitting balance-Leahy Scale: Good     Standing balance support: No upper extremity supported Standing balance-Leahy Scale: Fair Standing balance comment: LOB during dynamic movement to requiring therapist assist with gait belt                             Pertinent Vitals/Pain Pain Assessment Pain Assessment: No/denies pain    Home Living  Family/patient expects to be discharged to:: Private residence Living Arrangements: Spouse/significant other Available Help at Discharge: Family;Available 24 hours/day Type of Home: House Home Access: Stairs to enter Entrance Stairs-Rails: None Entrance Stairs-Number of Steps: 1   Home Layout: One level Home Equipment: Wheelchair - manual      Prior Function Prior Level of Function : Independent/Modified Independent                     Hand  Dominance   Dominant Hand: Right    Extremity/Trunk Assessment   Upper Extremity Assessment Upper Extremity Assessment: Defer to OT evaluation    Lower Extremity Assessment Lower Extremity Assessment: RLE deficits/detail RLE Coordination: decreased gross motor    Cervical / Trunk Assessment Cervical / Trunk Assessment: Normal  Communication   Communication: Expressive difficulties (aphasia)  Cognition Arousal/Alertness: Awake/alert Behavior During Therapy: WFL for tasks assessed/performed Overall Cognitive Status: Impaired/Different from baseline Area of Impairment: Safety/judgement, Awareness                         Safety/Judgement: Decreased awareness of safety, Decreased awareness of deficits Awareness: Emergent   General Comments: Pt VERY pleasant and cooperative. 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        General Comments General comments (skin integrity, edema, etc.): VSS    Exercises     Assessment/Plan    PT Assessment Patient needs continued PT services  PT Problem List Decreased activity tolerance;Decreased balance;Decreased mobility;Decreased knowledge of use of DME;Decreased safety awareness;Decreased coordination       PT Treatment Interventions DME instruction;Gait training;Stair training;Therapeutic activities;Functional mobility training;Therapeutic exercise;Balance training;Patient/family education    PT Goals (Current goals can be found in the Care Plan section)  Acute Rehab PT Goals Patient Stated Goal: to go home PT Goal Formulation: With patient Time For Goal Achievement: 09/02/21 Potential to Achieve Goals: Good    Frequency Min 4X/week     Co-evaluation               AM-PAC PT "6 Clicks" Mobility  Outcome Measure Help needed turning from your back to your side while in a flat bed without using bedrails?: None Help needed moving from lying on your back to  sitting on the side of a flat bed without using bedrails?: A Little Help needed moving to and from a bed to a chair (including a wheelchair)?: A Little Help needed standing up from a chair using your arms (e.g., wheelchair or bedside chair)?: A Little Help needed to walk in hospital room?: A Little Help needed climbing 3-5 steps with a railing? : A Little 6 Click Score: 19    End of Session Equipment Utilized During Treatment: Gait belt Activity Tolerance: Patient tolerated treatment well Patient left: in chair;with call bell/phone within reach;with family/visitor present Nurse Communication: Mobility status PT Visit Diagnosis: Muscle weakness (generalized) (M62.81)    Time: 2703-5009 PT Time Calculation (min) (ACUTE ONLY): 25 min   Charges:   PT Evaluation $PT Eval Moderate Complexity: 1 Mod PT Treatments $Gait Training: 8-22 mins        Kayliegh Boyers M,PT Acute Rehab Services [phone] (pager)   Alvira Philips 08/19/2021, 2:08 PM

## 2021-08-20 DIAGNOSIS — I4891 Unspecified atrial fibrillation: Secondary | ICD-10-CM

## 2021-08-20 DIAGNOSIS — I4821 Permanent atrial fibrillation: Secondary | ICD-10-CM

## 2021-08-20 HISTORY — DX: Unspecified atrial fibrillation: I48.91

## 2021-08-20 LAB — BASIC METABOLIC PANEL
Anion gap: 7 (ref 5–15)
BUN: 21 mg/dL (ref 8–23)
CO2: 23 mmol/L (ref 22–32)
Calcium: 8.8 mg/dL — ABNORMAL LOW (ref 8.9–10.3)
Chloride: 110 mmol/L (ref 98–111)
Creatinine, Ser: 1.15 mg/dL (ref 0.61–1.24)
GFR, Estimated: >60 mL/min (ref >60)
Glucose, Bld: 133 mg/dL — ABNORMAL HIGH (ref 70–99)
Potassium: 4 mmol/L (ref 3.5–5.1)
Sodium: 140 mmol/L (ref 135–145)

## 2021-08-20 LAB — CBC
HCT: 35.1 % — ABNORMAL LOW (ref 39.0–52.0)
Hemoglobin: 12.3 g/dL — ABNORMAL LOW (ref 13.0–17.0)
MCH: 33.6 pg (ref 26.0–34.0)
MCHC: 35 g/dL (ref 30.0–36.0)
MCV: 95.9 fL (ref 80.0–100.0)
Platelets: 187 10*3/uL (ref 150–400)
RBC: 3.66 MIL/uL — ABNORMAL LOW (ref 4.22–5.81)
RDW: 13.2 % (ref 11.5–15.5)
WBC: 7.9 10*3/uL (ref 4.0–10.5)
nRBC: 0 % (ref 0.0–0.2)

## 2021-08-20 MED ORDER — ROSUVASTATIN CALCIUM 10 MG PO TABS: 10.0000 mg | ORAL_TABLET | Freq: Every day | ORAL | 1 refills | Status: DC

## 2021-08-20 MED ORDER — APIXABAN 2.5 MG PO TABS
2.5000 mg | ORAL_TABLET | Freq: Two times a day (BID) | ORAL | 1 refills | Status: DC
Start: 2021-08-20 — End: 2021-09-22

## 2021-08-20 NOTE — Discharge Summary (Addendum)
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   Glucosamine-Chondroitin 750-600 MG Tabs Take 1 tablet by mouth daily.   multivitamin with minerals Tabs tablet Take 1 tablet by mouth daily.   PreserVision AREDS Tabs Take 1 tablet by mouth daily.   rosuvastatin 10 MG tablet Commonly known as: CRESTOR Take 1 tablet (10 mg total) by mouth daily. Start taking on: August 21, 2021   vitamin C 500 MG tablet Commonly known as: ASCORBIC ACID Take 500 mg by mouth daily.        LABORATORY STUDIES CBC    Component Value Date/Time   WBC 7.9 08/20/2021 0119   RBC 3.66 (L) 08/20/2021 0119   HGB 12.3 (L) 08/20/2021 0119   HCT 35.1 (L) 08/20/2021 0119   PLT 187 08/20/2021 0119   MCV 95.9 08/20/2021 0119   MCH 33.6 08/20/2021 0119   MCHC 35.0 08/20/2021 0119   RDW 13.2 08/20/2021 0119   CMP    Component Value Date/Time   NA 140 08/20/2021 0119   K 4.0 08/20/2021 0119   CL 110 08/20/2021 0119   CO2 23 08/20/2021 0119   GLUCOSE 133 (H) 08/20/2021 0119   BUN 21 08/20/2021 0119   CREATININE 1.15 08/20/2021 0119   CALCIUM 8.8 (L) 08/20/2021 0119   GFRNONAA >60 08/20/2021 0119   COAGSNo results found for: INR, PROTIME Lipid Panel    Component Value Date/Time   CHOL 132  08/18/2021 0421   TRIG 20 08/18/2021 0421   HDL 50 08/18/2021 0421   CHOLHDL 2.6 08/18/2021 0421   VLDL 4 08/18/2021 0421   LDLCALC 78 08/18/2021 0421   HgbA1C  Lab Results  Component Value Date   HGBA1C 5.5 08/18/2021   Urinalysis No results found for: COLORURINE, APPEARANCEUR, LABSPEC, PHURINE, GLUCOSEU, HGBUR, BILIRUBINUR, KETONESUR, PROTEINUR, UROBILINOGEN, NITRITE, LEUKOCYTESUR Urine Drug Screen No results found for: LABOPIA, COCAINSCRNUR, LABBENZ, AMPHETMU, THCU, LABBARB  Alcohol Level No results found for: ETH   SIGNIFICANT DIAGNOSTIC STUDIES MR ANGIO HEAD WO CONTRAST  Result Date: 08/19/2021 CLINICAL DATA:  Follow-up examination for acute stroke. EXAM: MRI HEAD WITHOUT CONTRAST MRA HEAD WITHOUT CONTRAST TECHNIQUE: Multiplanar, multi-echo pulse sequences of the brain and surrounding structures were acquired without intravenous contrast. Angiographic images of the Circle of Willis were acquired using MRA technique without intravenous contrast. COMPARISON:  Prior CT from 08/17/2021. FINDINGS: MRI HEAD FINDINGS Brain: Examination degraded by motion artifact. Generalized age-related cerebral atrophy. Patchy and confluent T2/FLAIR hyperintensity involving the periventricular and deep white matter both cerebral hemispheres as well as the pons, most consistent with chronic small vessel ischemic disease, moderately advanced in nature. Patchy small volume restricted diffusion seen  involving the left insular/subinsular region, within additional patchy cortical and subcortical involvement of the overlying left frontal lobe, consistent with acute left MCA distribution infarct. No associated hemorrhage or significant mass effect. No other evidence for acute or subacute ischemia. Gray-white matter differentiation otherwise maintained. No visible acute or chronic intracranial blood products. No mass lesion, midline shift or mass effect. No hydrocephalus or extra-axial fluid collection. Pituitary gland  suprasellar region within normal limits. Vascular: Susceptibility artifacts seen involving the proximal left MCA at the base of the left sylvian fissure, likely reflecting thrombus (series 8, image 38, 45). Major intracranial vascular flow voids are otherwise maintained. Skull and upper cervical spine: Basal junction within normal limits. Bone marrow signal intensity grossly normal. No scalp soft tissue abnormality. Sinuses/Orbits: Prior bilateral ocular lens replacement. Scattered mucosal thickening noted throughout the ethmoidal air cells. No significant mastoid effusion. Other: None. MRA HEAD FINDINGS Anterior circulation: Examination moderately degraded by motion artifact. Distal cervical segments of the internal carotid arteries are patent with antegrade flow. Both internal carotid arteries remain widely patent to the termini. Note again made of an approximate 2 mm focal outpouching extending laterally from the cavernous left ICA (series 3, image 67), which could reflect a small aneurysm versus vascular infundibulum. Additional small funnel shaped outpouching arising from the contralateral right carotid siphon consistent with a vascular infundibulum. A1 segments patent with short-segment fenestration on the left. Normal anterior communicating complex. Anterior cerebral arteries remain widely patent. Right MCA and its branch vessels are widely patent. On the left, the left M1 segment remains widely patent. There is persistent occlusive thrombus within a proximal left M2 branch just distal to the bifurcation. This corresponds with susceptibility artifacts seen on brain MRI, and is relatively stable from prior CTA. Remainder of the left MCA branches remain patent and perfused. Posterior circulation: Both V4 segments patent to the vertebrobasilar junction. Both PICA patent proximally. Basilar patent to its distal aspect without stenosis. Superior cerebellar arteries patent bilaterally. Left PCA primarily supplied  via the basilar. Predominant fetal type origin of the right PCA. PCAs remain widely patent to their distal aspects. Anatomic variants: None significant. IMPRESSION: MRI HEAD IMPRESSION: 1. Patchy small volume acute ischemic infarcts involving the left insular/subinsular region as well as the overlying left frontal lobe. No associated hemorrhage or significant mass effect. 2. Underlying age-related cerebral atrophy with moderate chronic microvascular ischemic disease. MRA HEAD IMPRESSION: 1. Persistent occlusive thrombus within a proximal left M2 branch just distal to the bifurcation. Finding is relatively stable as compared to prior CTA. 2. Otherwise wide patency of the intracranial arterial circulation. No hemodynamically significant or correctable stenosis. 3. 2 mm focal outpouching extending laterally from the cavernous left ICA, which could reflect a small aneurysm versus vascular infundibulum. Attention at follow-up recommended. Electronically Signed   By: Jeannine Boga M.D.   On: 08/19/2021 02:10   MR BRAIN WO CONTRAST  Result Date: 08/19/2021 CLINICAL DATA:  Follow-up examination for acute stroke. EXAM: MRI HEAD WITHOUT CONTRAST MRA HEAD WITHOUT CONTRAST TECHNIQUE: Multiplanar, multi-echo pulse sequences of the brain and surrounding structures were acquired without intravenous contrast. Angiographic images of the Circle of Willis were acquired using MRA technique without intravenous contrast. COMPARISON:  Prior CT from 08/17/2021. FINDINGS: MRI HEAD FINDINGS Brain: Examination degraded by motion artifact. Generalized age-related cerebral atrophy. Patchy and confluent T2/FLAIR hyperintensity involving the periventricular and deep white matter both cerebral hemispheres as well as the pons, most consistent with chronic small vessel ischemic disease, moderately advanced in nature. Patchy  small volume restricted diffusion seen involving the left insular/subinsular region, within additional patchy  cortical and subcortical involvement of the overlying left frontal lobe, consistent with acute left MCA distribution infarct. No associated hemorrhage or significant mass effect. No other evidence for acute or subacute ischemia. Gray-white matter differentiation otherwise maintained. No visible acute or chronic intracranial blood products. No mass lesion, midline shift or mass effect. No hydrocephalus or extra-axial fluid collection. Pituitary gland suprasellar region within normal limits. Vascular: Susceptibility artifacts seen involving the proximal left MCA at the base of the left sylvian fissure, likely reflecting thrombus (series 8, image 38, 45). Major intracranial vascular flow voids are otherwise maintained. Skull and upper cervical spine: Basal junction within normal limits. Bone marrow signal intensity grossly normal. No scalp soft tissue abnormality. Sinuses/Orbits: Prior bilateral ocular lens replacement. Scattered mucosal thickening noted throughout the ethmoidal air cells. No significant mastoid effusion. Other: None. MRA HEAD FINDINGS Anterior circulation: Examination moderately degraded by motion artifact. Distal cervical segments of the internal carotid arteries are patent with antegrade flow. Both internal carotid arteries remain widely patent to the termini. Note again made of an approximate 2 mm focal outpouching extending laterally from the cavernous left ICA (series 3, image 67), which could reflect a small aneurysm versus vascular infundibulum. Additional small funnel shaped outpouching arising from the contralateral right carotid siphon consistent with a vascular infundibulum. A1 segments patent with short-segment fenestration on the left. Normal anterior communicating complex. Anterior cerebral arteries remain widely patent. Right MCA and its branch vessels are widely patent. On the left, the left M1 segment remains widely patent. There is persistent occlusive thrombus within a proximal left  M2 branch just distal to the bifurcation. This corresponds with susceptibility artifacts seen on brain MRI, and is relatively stable from prior CTA. Remainder of the left MCA branches remain patent and perfused. Posterior circulation: Both V4 segments patent to the vertebrobasilar junction. Both PICA patent proximally. Basilar patent to its distal aspect without stenosis. Superior cerebellar arteries patent bilaterally. Left PCA primarily supplied via the basilar. Predominant fetal type origin of the right PCA. PCAs remain widely patent to their distal aspects. Anatomic variants: None significant. IMPRESSION: MRI HEAD IMPRESSION: 1. Patchy small volume acute ischemic infarcts involving the left insular/subinsular region as well as the overlying left frontal lobe. No associated hemorrhage or significant mass effect. 2. Underlying age-related cerebral atrophy with moderate chronic microvascular ischemic disease. MRA HEAD IMPRESSION: 1. Persistent occlusive thrombus within a proximal left M2 branch just distal to the bifurcation. Finding is relatively stable as compared to prior CTA. 2. Otherwise wide patency of the intracranial arterial circulation. No hemodynamically significant or correctable stenosis. 3. 2 mm focal outpouching extending laterally from the cavernous left ICA, which could reflect a small aneurysm versus vascular infundibulum. Attention at follow-up recommended. Electronically Signed   By: Jeannine Boga M.D.   On: 08/19/2021 02:10   ECHOCARDIOGRAM COMPLETE  Result Date: 08/18/2021    ECHOCARDIOGRAM REPORT   Patient Name:   Robert Webb Date of Exam: 08/18/2021 Medical Rec #:  [id number]       Height:       67.0 in Accession #:    [id number]      Weight:       123.9 lb Date of Birth:  05/04/1934       BSA:          1.650 m Patient Age:    54 years        BP:  143/82 mmHg Patient Gender: M               HR:           70 bpm. Exam Location:  Inpatient Procedure: 2D Echo, Cardiac  Doppler and Color Doppler Indications:    CVA  History:        Patient has no prior history of Echocardiogram examinations.  Sonographer:    Luisa Hart RDCS Referring Phys: [id number] [place]  1. Left ventricular ejection fraction, by estimation, is 45 to 50%. The left ventricle has mildly decreased function. The left ventricle demonstrates regional wall motion abnormalities (see scoring diagram/findings for description). There is severe concentric left ventricular hypertrophy. Left ventricular diastolic function could not be evaluated.  2. Right ventricular systolic function is normal. The right ventricular size is normal. Moderately increased right ventricular wall thickness. There is normal pulmonary artery systolic pressure.  3. Left atrial size was severely dilated.  4. Right atrial size was severely dilated.  5. The mitral valve is normal in structure. Trivial mitral valve regurgitation. No evidence of mitral stenosis.  6. The aortic valve is tricuspid. Aortic valve regurgitation is trivial. No aortic stenosis is present.  7. Aortic dilatation noted. There is mild dilatation of the ascending aorta, measuring 37 mm. There is mild dilatation of the aortic root, measuring 40 mm.  8. The inferior vena cava is normal in size with <50% respiratory variability, suggesting right atrial pressure of 8 mmHg. FINDINGS  Left Ventricle: Left ventricular ejection fraction, by estimation, is 45 to 50%. The left ventricle has mildly decreased function. The left ventricle demonstrates regional wall motion abnormalities. The left ventricular internal cavity size was normal in size. There is severe concentric left ventricular hypertrophy. Left ventricular diastolic function could not be evaluated due to atrial fibrillation. Left ventricular diastolic function could not be evaluated.  LV Wall Scoring: The antero-lateral wall, basal anteroseptal segment, basal inferolateral segment, basal anterior segment, basal  inferior segment, and basal inferoseptal segment are hypokinetic. The mid and distal anterior wall, mid and distal lateral wall, mid and distal anterior septum, entire apex, mid and distal inferior wall, and mid inferoseptal segment are normal. Basal hypokinesis with sparing of the mid to apical segments. Reverse Takasubo physiology. Right Ventricle: The right ventricular size is normal. Moderately increased right ventricular wall thickness. Right ventricular systolic function is normal. There is normal pulmonary artery systolic pressure. The tricuspid regurgitant velocity is 2.28 m/s, and with an assumed right atrial pressure of 8 mmHg, the estimated right ventricular systolic pressure is 64.4 mmHg. Left Atrium: Left atrial size was severely dilated. Right Atrium: Right atrial size was severely dilated. Pericardium: Trivial pericardial effusion is present. Mitral Valve: The mitral valve is normal in structure. Trivial mitral valve regurgitation. No evidence of mitral valve stenosis. MV peak gradient, 5.3 mmHg. The mean mitral valve gradient is 2.0 mmHg. Tricuspid Valve: The tricuspid valve is normal in structure. Tricuspid valve regurgitation is trivial. No evidence of tricuspid stenosis. Aortic Valve: The aortic valve is tricuspid. Aortic valve regurgitation is trivial. No aortic stenosis is present. Aortic valve mean gradient measures 3.6 mmHg. Aortic valve peak gradient measures 6.9 mmHg. Aortic valve area, by VTI measures 2.44 cm. Pulmonic Valve: The pulmonic valve was normal in structure. Pulmonic valve regurgitation is mild. No evidence of pulmonic stenosis. Aorta: Aortic dilatation noted. There is mild dilatation of the ascending aorta, measuring 37 mm. There is mild dilatation of the aortic root, measuring 40 mm. Venous:  The inferior vena cava is normal in size with less than 50% respiratory variability, suggesting right atrial pressure of 8 mmHg. IAS/Shunts: No atrial level shunt detected by color flow  Doppler. Additional Comments: Biventricular hypertrophy, severe atrial enlargement and pericardial effusion. Consider evaluation for amyloidosis or other infiltrative cardiomyopathies.  LEFT VENTRICLE PLAX 2D LVIDd:         4.20 cm LVIDs:         3.40 cm LV PW:         1.90 cm LV IVS:        1.80 cm LVOT diam:     2.30 cm LV SV:         55 LV SV Index:   33 LVOT Area:     4.15 cm  LV Volumes (MOD) LV vol d, MOD A2C: 61.4 ml LV vol d, MOD A4C: 58.3 ml LV vol s, MOD A2C: 37.2 ml LV vol s, MOD A4C: 33.2 ml LV SV MOD A2C:     24.2 ml LV SV MOD A4C:     58.3 ml LV SV MOD BP:      25.3 ml RIGHT VENTRICLE RV Basal diam:  3.70 cm RV Mid diam:    1.20 cm RV S prime:     13.70 cm/s TAPSE (M-mode): 1.7 cm LEFT ATRIUM              Index        RIGHT ATRIUM           Index LA diam:        4.30 cm  2.61 cm/m   RA Area:     23.10 cm LA Vol (A2C):   125.0 ml 75.76 ml/m  RA Volume:   75.60 ml  45.82 ml/m LA Vol (A4C):   86.2 ml  52.25 ml/m LA Biplane Vol: 105.0 ml 63.64 ml/m  AORTIC VALVE                    PULMONIC VALVE AV Area (Vmax):    2.46 cm     PV Vmax:          0.95 m/s AV Area (Vmean):   2.38 cm     PV Vmean:         62.850 cm/s AV Area (VTI):     2.44 cm     PV VTI:           0.175 m AV Vmax:           131.80 cm/s  PV Peak grad:     3.6 mmHg AV Vmean:          86.880 cm/s  PV Mean grad:     2.0 mmHg AV VTI:            0.224 m      PR End Diast Vel: 8.41 msec AV Peak Grad:      6.9 mmHg AV Mean Grad:      3.6 mmHg LVOT Vmax:         77.95 cm/s LVOT Vmean:        49.775 cm/s LVOT VTI:          0.132 m LVOT/AV VTI ratio: 0.59  AORTA Ao Root diam: 4.00 cm Ao Asc diam:  3.70 cm MITRAL VALVE                  TRICUSPID VALVE MV Area (PHT): 4.58 cm       TR Peak  grad:   20.8 mmHg MV Area VTI:   2.20 cm       TR Vmax:        228.00 cm/s MV Peak grad:  5.3 mmHg MV Mean grad:  2.0 mmHg       SHUNTS MV Vmax:       1.15 m/s       Systemic VTI:  0.13 m MV Vmean:      62.2 cm/s      Systemic Diam: 2.30 cm MR Peak grad:     98.0 mmHg MR Mean grad:    71.0 mmHg MR Vmax:         495.00 cm/s MR Vmean:        404.0 cm/s MR PISA:         1.57 cm MR PISA Eff ROA: 10 mm MR PISA Radius:  0.50 cm MV E velocity: 111.67 cm/s Robert Latch MD Electronically signed by Robert Latch MD Signature Date/Time: 08/18/2021/11:23:03 AM    Final       HISTORY OF PRESENT ILLNESS  Robert Webb is a 87 y.o. male with no significant past medical history, does go to yearly medical checkups, who presented to [facility] with acute onset right arm pain and weakness as well as facial droop and right hemianopia.   He was evaluated by tele neurology and administered tPA at 1910.  Subsequently CTA demonstrated an left M2 proximal occlusion which per report to me was discussed with Dr. Karenann Cai, with decision made not to intervene given relatively improving symptoms post tPA. I accepted him for transfer to [facility] for close neurological monitoring and further stroke work-up   On arrival to [facility] he was found to be in atrial fibrillation, which he denies any known history of.  He denies any symptoms including any significant dizziness although he notes dizziness runs in his family (possibly Mnire's disease).  He reports he takes no medications and has a mild allergy (intolerance) to Demerol.  He does not smoke or drink.  He has had no other transient neurological symptoms or signs or symptoms of infection or signs or symptoms of bleeding.  He has not to his knowledge injured his right arm, although he did develop a large bruise which he reports occurred while he was at [facility], and he notes the pain in that arm is improving at this time   45 minutes prior to arrival at [facility] tPA given?:  Yes, at 1910 on 5/31 IA performed?: No Premorbid modified rankin scale:      0 - No symptoms.  HOSPITAL COURSE Robert Webb is a 87 y.o. male with no significant past medical history  admitted for right arm weakness, right hemianopia and facial droop. TPA given at [facility]. MRI shows patchy small volume acute ischemic infarcts in the left insular/sub insular area and left frontal lobe. Start eliquis 6/1. Outpatient referral to cardiology for new onset afib ordered. Follow up with neurology outpatient as well.  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  MRA Persistent occlusive thrombus within a proximal left M2 branch just distal to the bifurcation.  2D Echo EF 45 to 50% LDL 78  HgbA1c 5.5 SCDs for VTE prophylaxis No antithrombotic prior to admission, now on eliquis 2.'5mg'$  bid. Continue on discharge Patient counseled to be compliant with his antithrombotic medications Ongoing aggressive stroke risk factor management Therapy recommendations: outpt PT Disposition: Pending   Afib Present in ED and now in ICU New diagnosis Rate controlled Eliquis 2.'5mg'$  BID   Hypertension stable BP <180/105 post tPA Long term BP goal normotensive   Hyperlipidemia Home meds: None LDL 78, goal < 70 Now on Crestor 10, no high intensity given LDL close to goal Continue statin at discharge   Other Stroke Risk Factors Advanced age   Other Active Problems None   DISCHARGE EXAM Blood pressure (!) 121/51, pulse 79, temperature 98.1 F (36.7 C), temperature source Oral, resp. rate 14, height '5\' 7"'$  (1.702 m), weight 56.2 kg, SpO2 96 %. General - Well nourished, well developed, in no apparent distress.   Ophthalmologic - fundi not visualized due to noncooperation.   Cardiovascular - irregularly irregular heart rate and rhythm.   Mental Status -  Level of arousal and orientation to age,  place, time and person were intact. Language exam showed mild expressive aphasia with word finding difficulty. Mild dysarthria.  Fund of Knowledge was assessed and was intact.   Cranial Nerves II - XII - II - Visual field intact OU. III, IV, VI - Extraocular movements intact. V - Facial sensation intact bilaterally. VII - right facial droop VIII - Hearing & vestibular intact bilaterally. X - Palate elevates symmetrically XI - Chin turning & shoulder shrug intact bilaterally. XII - Tongue protrusion intact.   Motor Strength - The patient's strength was normal in BLEs and LUE, however, RUE proximal 3+/5 (mainly due to pain), distal 5-/5. RUE significant ecchymosis with mild tender to touch.  Bulk was normal and fasciculations were absent.   Motor Tone - Muscle tone was assessed at the neck and appendages and was normal. Reflexes - The patient's reflexes were symmetrical in all extremities and he had no pathological reflexes. Sensory - Light touch, temperature/pinprick were assessed and were symmetrical.   Coordination - The patient had normal movements in the left hand with no ataxia or dysmetria. RUE FTN slow but seems intact. Tremor was absent. Gait and Station - deferred.  Discharge Diet       Diet   Diet Heart Room service appropriate? Yes with Assist; Fluid consistency: Thin   liquids  DISCHARGE PLAN Disposition:  Home with outpatient PT Eliquis (apixaban) daily for secondary stroke prevention  Ongoing stroke risk factor control by Primary Care Physician at time of discharge Follow-up PCP Robert Dress, MD in 2 weeks. Follow-up in [facility] in 4 weeks, office to schedule an appointment.  Follow up with Cardiology for new onset afib. Referral sent.  Outpatient physical therapy referral sent by case management   35 minutes were spent preparing discharge.  Patient seen and examined by NP/APP with MD. MD to update note as needed.   Janine Ores, DNP, FNP-BC [facility] Pager: [phone]  ATTENDING NOTE: I reviewed above note and agree with the assessment and plan. Pt was seen and examined.   No family at bedside.  Patient lying bed, however went to bathroom with minimal assistance.  Still has mild expressive aphasia, difficult word finding and hesitancy of speech.  Right arm pain improved and strength is also improving.  Still has right arm ecchymosis and soft tissue hematoma.  On Eliquis, tolerating well.  Heart rate controlled.  Continue statin.  PT/OT recommend outpatient therapy.  Patient will follow-up with cardiology in [place] for new onset A-fib.  He will also neurology 4 weeks at [facility].  For detailed assessment and plan, please refer to above as I have made changes wherever appropriate.   Rosalin Hawking, MD PhD Stroke Neurology 08/20/2021 12:15 PM

## 2021-08-20 NOTE — Progress Notes (Signed)
Speech Language Pathology Treatment: Cognitive-Linquistic  Patient Details Name: Robert Webb MRN: [id number] DOB: 01/13/1935 Today's Date: 08/20/2021 Time: 6962-9528 SLP Time Calculation (min) (ACUTE ONLY): 17 min  Assessment / Plan / Recommendation Clinical Impression  Pt was seen for treatment with his wife and daughter present. All parties agreed that the pt's speech and language skills are notably improved compared to when he was initially admitted. He stated that his speech and language impairments are approximately 80% back to baseline, and his family was in agreement. Pt used compensatory strategies for speech intelligibility at the conversational level with intermittent cues for overarticulation. Pt's daughter and wife were educated regarding strategies to improve auditory comprehension, and facilitate the pt's word retrieval. Both parties verbalized understanding as well as agreement regarding this and pt's need for continued SLP services following discharge.    HPI HPI: Pt is an 87 y.o. male who presented to [facility] with acute onset right arm pain, weakness, facial droop, and right hemianopia. 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 TPA given and pt transferred to [facility] for further work up. No significant PMH.      SLP Plan  Continue with current plan of care      Recommendations for follow up therapy are one component of a multi-disciplinary discharge planning process, led by the attending physician.  Recommendations may be updated based on patient status, additional functional criteria and insurance authorization.    Recommendations                   Oral Care Recommendations: Oral care BID Follow Up Recommendations: Outpatient SLP Assistance recommended at discharge: Intermittent Supervision/Assistance SLP Visit Diagnosis: Aphasia (R47.01);Dysarthria and anarthria (R47.1) Plan: Continue with current plan of care          Addaline Peplinski I.  Hardin Negus, [place], [place] Office number [phone] Pager Bureau  08/20/2021, 12:49 PM

## 2021-08-20 NOTE — Care Management (Addendum)
87 y.o. male presents to [facility] hospital on 5.31.2023 with R arm pain/weakness, facial droop, R hemianopia. CTA demonstrates L M2 occulsion. Pt received tPA. No significant PMH noted.  Patient medically stable for discharge today with spouse, who can provide 24-hour assistance.  PT/OT/ST recommending outpatient follow-up, and patient agreeable; he prefers [facility] if possible.  Referral made to [facility] outpatient rehab for continued therapy at discharge.  Reinaldo Raddle, RN, BSN  Trauma/Neuro ICU Case Manager [phone]

## 2021-08-20 NOTE — Progress Notes (Signed)
Patient stable for discharge. After summary visit reviewed with patient and family per RN and neuro NP.  Patient discharge home with spouse and daughter.  All belongings accounted for.

## 2021-08-21 DIAGNOSIS — G459 Transient cerebral ischemic attack, unspecified: Secondary | ICD-10-CM | POA: Diagnosis not present

## 2021-08-21 DIAGNOSIS — K5730 Diverticulosis of large intestine without perforation or abscess without bleeding: Secondary | ICD-10-CM | POA: Diagnosis not present

## 2021-08-21 DIAGNOSIS — R339 Retention of urine, unspecified: Secondary | ICD-10-CM | POA: Diagnosis not present

## 2021-08-21 DIAGNOSIS — R1030 Lower abdominal pain, unspecified: Secondary | ICD-10-CM | POA: Diagnosis not present

## 2021-08-21 DIAGNOSIS — Z8673 Personal history of transient ischemic attack (TIA), and cerebral infarction without residual deficits: Secondary | ICD-10-CM | POA: Diagnosis not present

## 2021-08-21 DIAGNOSIS — Z743 Need for continuous supervision: Secondary | ICD-10-CM | POA: Diagnosis not present

## 2021-08-30 DIAGNOSIS — R339 Retention of urine, unspecified: Secondary | ICD-10-CM | POA: Diagnosis not present

## 2021-08-30 DIAGNOSIS — Z79899 Other long term (current) drug therapy: Secondary | ICD-10-CM | POA: Diagnosis not present

## 2021-09-03 DIAGNOSIS — M6281 Muscle weakness (generalized): Secondary | ICD-10-CM | POA: Diagnosis not present

## 2021-09-03 DIAGNOSIS — R2681 Unsteadiness on feet: Secondary | ICD-10-CM | POA: Diagnosis not present

## 2021-09-03 DIAGNOSIS — Z8673 Personal history of transient ischemic attack (TIA), and cerebral infarction without residual deficits: Secondary | ICD-10-CM | POA: Diagnosis not present

## 2021-09-15 DIAGNOSIS — R339 Retention of urine, unspecified: Secondary | ICD-10-CM | POA: Diagnosis not present

## 2021-09-17 DIAGNOSIS — R339 Retention of urine, unspecified: Secondary | ICD-10-CM | POA: Diagnosis not present

## 2021-09-22 ENCOUNTER — Ambulatory Visit: Payer: Medicare Other | Admitting: Cardiology

## 2021-09-22 VITALS — BP 136/72 | HR 75 | Ht 67.0 in | Wt 123.0 lb

## 2021-09-22 DIAGNOSIS — I4891 Unspecified atrial fibrillation: Secondary | ICD-10-CM

## 2021-09-22 DIAGNOSIS — E782 Mixed hyperlipidemia: Secondary | ICD-10-CM

## 2021-09-22 DIAGNOSIS — I63512 Cerebral infarction due to unspecified occlusion or stenosis of left middle cerebral artery: Secondary | ICD-10-CM | POA: Diagnosis not present

## 2021-09-22 HISTORY — DX: Mixed hyperlipidemia: E78.2

## 2021-09-22 MED ORDER — APIXABAN 2.5 MG PO TABS: 2.5000 mg | ORAL_TABLET | Freq: Two times a day (BID) | ORAL | 3 refills | Status: DC

## 2021-09-22 MED ORDER — ROSUVASTATIN CALCIUM 10 MG PO TABS: 10.0000 mg | ORAL_TABLET | Freq: Every day | ORAL | 3 refills | Status: DC

## 2021-09-22 NOTE — Patient Instructions (Signed)

## 2021-09-22 NOTE — Progress Notes (Signed)
Cardiology Office Note:    Date:  09/22/2021   ID:  Robert Webb, DOB Aug 29, 1934, MRN [id number]  PCP:  Nicoletta Dress, MD  Cardiologist:  Jenean Lindau, MD   Referring MD: Janine Ores, NP    ASSESSMENT:    1. Atrial fibrillation, unspecified type ([name])   2. Acute ischemic left MCA stroke ([name])   3. Mixed dyslipidemia    PLAN:    In order of problems listed above:  Primary prevention stressed with the patient.  Importance of compliance with diet medication stressed any vocalized understanding.  He was advised to walk and exercise to the best of his ability. Atrial fibrillation: Newly diagnosed: Patient is on anticoagulation.I discussed with the patient atrial fibrillation, disease process. Management and therapy including rate and rhythm control, anticoagulation benefits and potential risks were discussed extensively with the patient. Patient had multiple questions which were answered to patient's satisfaction.  Not sure how long he has had atrial fibrillation.  Do not see any particular reason to attempt cardioversion now or in the future these issues were discussed with patient and family at extensive length and questions were answered to their satisfaction. Post stroke and post thrombolysis: Has recovered exceedingly well. Mixed dyslipidemia: Diet was emphasized.  Lifestyle modification urged.  Lipids followed by primary care. Patient will be seen in follow-up appointment in 6 months or earlier if the patient has any concerns    Medication Adjustments/Labs and Tests Ordered: Current medicines are reviewed at length with the patient today.  Concerns regarding medicines are outlined above.  Orders Placed This Encounter  Procedures   EKG 12-Lead   Meds ordered this encounter  Medications   apixaban (ELIQUIS) 2.5 MG TABS tablet    Sig: Take 1 tablet (2.5 mg total) by mouth 2 (two) times daily.    Dispense:  180 tablet    Refill:  3   rosuvastatin (CRESTOR) 10 MG  tablet    Sig: Take 1 tablet (10 mg total) by mouth daily.    Dispense:  90 tablet    Refill:  3     History of Present Illness:    Robert Webb is a 87 y.o. male who is being seen today for the evaluation of atrial fibrillation for stroke at the request of Janine Ores, NP.  Patient is a pleasant 87 year old male he has no significant past medical history.  He is a very healthy male.  He went to the hospital with a stroke and underwent tPA treatment and is transferred to [facility].  Subsequently is done fine.  His wife and daughter accompany him for this visit.  The mentioned to me that he is doing extremely well.  No chest pain orthopnea or PND.  There are no apparent residual effects of the stroke other than the fact that if he tries to speak fast he has some issues.  Past Medical History:  Diagnosis Date   Acute ischemic left MCA stroke ([name]) 08/18/2021   Atrial fibrillation ([name]) 08/20/2021    Past Surgical History:  Procedure Laterality Date   CHOLECYSTECTOMY     HERNIA REPAIR      Current Medications: Current Meds  Medication Sig   bethanechol (URECHOLINE) 25 MG tablet Take 25 mg by mouth 2 (two) times daily.   dutasteride (AVODART) 0.5 MG capsule Take 0.5 mg by mouth daily.   fexofenadine (ALLEGRA) 180 MG tablet Take 180 mg by mouth as needed for allergies or rhinitis.   tamsulosin (FLOMAX)  0.4 MG CAPS capsule Take 0.4 mg by mouth daily.     Allergies:   Demerol [meperidine hcl]   Social History   Socioeconomic History   Marital status: Married    Spouse name: Eusevio Schriver   Number of children: 1   Years of education: Not on file   Highest education level: Not on file  Occupational History   Occupation: retired  Tobacco Use   Smoking status: Former    Types: Cigarettes    Passive exposure: Never   Smokeless tobacco: Never   Tobacco comments:    Pt states he smoked when he was in high school   Vaping Use   Vaping Use: Never used  Substance and  Sexual Activity   Alcohol use: Never   Drug use: Never   Sexual activity: Not Currently  Other Topics Concern   Not on file  Social History Narrative   Not on file   Social Determinants of Health   Financial Resource Strain: Low Risk  (08/18/2021)   Overall Financial Resource Strain (CARDIA)    Difficulty of Paying Living Expenses: Not hard at all  Food Insecurity: No Food Insecurity (08/18/2021)   Hunger Vital Sign    Worried About Running Out of Food in the Last Year: Never true    [place] in the Last Year: Never true  Transportation Needs: No Transportation Needs (08/18/2021)   PRAPARE - Hydrologist (Medical): No    Lack of Transportation (Non-Medical): No  Physical Activity: Not on file  Stress: No Stress Concern Present (08/18/2021)   [place]    Feeling of Stress : Not at all  Social Connections: [place] (08/18/2021)   Social Connection and Isolation Panel [NHANES]    Frequency of Communication with Friends and Family: More than three times a week    Frequency of Social Gatherings with Friends and Family: Three times a week    Attends Religious Services: More than 4 times per year    Active Member of Clubs or Organizations: Yes    Attends Music therapist: More than 4 times per year    Marital Status: Married     Family History: The patient's family history includes Cancer in his mother. There is no history of Hypertension, Diabetes, or Heart disease.  ROS:   Please see the history of present illness.    All other systems reviewed and are negative.  EKGs/Labs/Other Studies Reviewed:    The following studies were reviewed today: EKG reveals atrial fibrillation with well-controlled ventricular rate.   Recent Labs: 08/18/2021: TSH 1.415 08/20/2021: BUN 21; Creatinine, Ser 1.15; Hemoglobin 12.3; Platelets 187; Potassium 4.0; Sodium 140  Recent  Lipid Panel    Component Value Date/Time   CHOL 132 08/18/2021 0421   TRIG 20 08/18/2021 0421   HDL 50 08/18/2021 0421   CHOLHDL 2.6 08/18/2021 0421   VLDL 4 08/18/2021 0421   LDLCALC 78 08/18/2021 0421    Physical Exam:    VS:  BP 136/72   Pulse 75   Ht '5\' 7"'$  (1.702 m)   Wt 123 lb (55.8 kg)   SpO2 98%   BMI 19.26 kg/m     Wt Readings from Last 3 Encounters:  09/22/21 123 lb (55.8 kg)  08/18/21 123 lb 14.4 oz (56.2 kg)     GEN: Patient is in no acute distress HEENT: Normal NECK: No JVD; No carotid bruits  LYMPHATICS: No lymphadenopathy CARDIAC: S1 S2 regular, 2/6 systolic murmur at the apex. RESPIRATORY:  Clear to auscultation without rales, wheezing or rhonchi  ABDOMEN: Soft, non-tender, non-distended MUSCULOSKELETAL:  No edema; No deformity  SKIN: Warm and dry NEUROLOGIC:  Alert and oriented x 3 PSYCHIATRIC:  Normal affect    Signed, Jenean Lindau, MD  09/22/2021 11:17 AM    [place]

## 2021-09-24 ENCOUNTER — Other Ambulatory Visit: Payer: Self-pay | Admitting: Family Medicine

## 2021-09-24 DIAGNOSIS — C61 Malignant neoplasm of prostate: Secondary | ICD-10-CM

## 2021-09-25 DIAGNOSIS — Z139 Encounter for screening, unspecified: Secondary | ICD-10-CM | POA: Diagnosis not present

## 2021-09-25 DIAGNOSIS — Z9181 History of falling: Secondary | ICD-10-CM | POA: Diagnosis not present

## 2021-09-25 DIAGNOSIS — Z0000 Encounter for general adult medical examination without abnormal findings: Secondary | ICD-10-CM | POA: Diagnosis not present

## 2021-09-29 DIAGNOSIS — R339 Retention of urine, unspecified: Secondary | ICD-10-CM | POA: Diagnosis not present

## 2021-10-11 ENCOUNTER — Encounter: Payer: Self-pay | Admitting: Adult Health

## 2021-10-11 ENCOUNTER — Ambulatory Visit: Payer: Medicare Other | Admitting: Adult Health

## 2021-10-11 VITALS — BP 146/79 | HR 81 | Ht 67.0 in | Wt 124.5 lb

## 2021-10-11 DIAGNOSIS — Z09 Encounter for follow-up examination after completed treatment for conditions other than malignant neoplasm: Secondary | ICD-10-CM | POA: Diagnosis not present

## 2021-10-11 DIAGNOSIS — I4891 Unspecified atrial fibrillation: Secondary | ICD-10-CM

## 2021-10-11 DIAGNOSIS — I63412 Cerebral infarction due to embolism of left middle cerebral artery: Secondary | ICD-10-CM

## 2021-10-11 NOTE — Patient Instructions (Addendum)
Continue working with therapies for hopeful ongoing recovery  Continue  Eliquis 2.5 mg twice daily   and Crestor 10 mg daily for secondary stroke prevention  Continue to follow with cardiology for atrial fibrillation and Eliquis management  Continue to follow up with PCP regarding blood pressure and cholesterol management  Maintain strict control of hypertension with blood pressure goal below 130/90 and cholesterol with LDL cholesterol (bad cholesterol) goal below 70 mg/dL.   Signs of a Stroke? Follow the BEFAST method:  Balance Watch for a sudden loss of balance, trouble with coordination or vertigo Eyes Is there a sudden loss of vision in one or both eyes? Or double vision?  Face: Ask the person to smile. Does one side of the face droop or is it numb?  Arms: Ask the person to raise both arms. Does one arm drift downward? Is there weakness or numbness of a leg? Speech: Ask the person to repeat a simple phrase. Does the speech sound slurred/strange? Is the person confused ? Time: If you observe any of these signs, call 911.        Thank you for coming to see [place] at [facility]. I hope we have been able to provide you high quality care today.  You may receive a patient satisfaction survey over the next few weeks. We would appreciate your feedback and comments so that we may continue to improve ourselves and the health of our patients.    Stroke Prevention Some medical conditions and lifestyle choices can lead to a higher risk for a stroke. You can help to prevent a stroke by eating healthy foods and exercising. It also helps to not smoke and to manage any health problems you may have. How can this condition affect me? A stroke is an emergency. It should be treated right away. A stroke can lead to brain damage or threaten your life. There is a better chance of surviving and getting better after a stroke if you get medical help right away. What can increase my  risk? The following medical conditions may increase your risk of a stroke: Diseases of the heart and blood vessels (cardiovascular disease). High blood pressure (hypertension). Diabetes. High cholesterol. Sickle cell disease. Problems with blood clotting. Being very overweight. Sleeping problems (obstructivesleep apnea). Other risk factors include: Being older than age 70. A history of blood clots, stroke, or mini-stroke (TIA). Race, ethnic background, or a family history of stroke. Smoking or using tobacco products. Taking birth control pills, especially if you smoke. Heavy alcohol and drug use. Not being active. What actions can I take to prevent this? Manage your health conditions High cholesterol. Eat a healthy diet. If this is not enough to manage your cholesterol, you may need to take medicines. Take medicines as told by your doctor. High blood pressure. Try to keep your blood pressure below 130/80. If your blood pressure cannot be managed through a healthy diet and regular exercise, you may need to take medicines. Take medicines as told by your doctor. Ask your doctor if you should check your blood pressure at home. Have your blood pressure checked every year. Diabetes. Eat a healthy diet and get regular exercise. If your blood sugar (glucose) cannot be managed through diet and exercise, you may need to take medicines. Take medicines as told by your doctor. Talk to your doctor about getting checked for sleeping problems. Signs of a problem can include: Snoring a lot. Feeling very tired. Make sure that you manage any other  conditions you have. Nutrition  Follow instructions from your doctor about what to eat or drink. You may be told to: Eat and drink fewer calories each day. Limit how much salt (sodium) you use to 1,500 milligrams (mg) each day. Use only healthy fats for cooking, such as olive oil, canola oil, and sunflower oil. Eat healthy foods. To do this: Choose  foods that are high in fiber. These include whole grains, and fresh fruits and vegetables. Eat at least 5 servings of fruits and vegetables a day. Try to fill one-half of your plate with fruits and vegetables at each meal. Choose low-fat (lean) proteins. These include low-fat cuts of meat, chicken without skin, fish, tofu, beans, and nuts. Eat low-fat dairy products. Avoid foods that: Are high in salt. Have saturated fat. Have trans fat. Have cholesterol. Are processed or pre-made. Count how many carbohydrates you eat and drink each day. Lifestyle If you drink alcohol: Limit how much you have to: 0-1 drink a day for women who are not pregnant. 0-2 drinks a day for men. Know how much alcohol is in your drink. In the [place] one drink equals one 12 oz bottle of beer (357m), one 5 oz glass of wine (1470m, or one 1 oz glass of hard liquor (4444m Do not smoke or use any products that have nicotine or tobacco. If you need help quitting, ask your doctor. Avoid secondhand smoke. Do not use drugs. Activity  Try to stay at a healthy weight. Get at least 30 minutes of exercise on most days, such as: Fast walking. Biking. Swimming. Medicines Take over-the-counter and prescription medicines only as told by your doctor. Avoid taking birth control pills. Talk to your doctor about the risks of taking birth control pills if: You are over 35 73ars old. You smoke. You get very bad headaches. You have had a blood clot. Where to find more information American Stroke Association: www.strokeassociation.org Get help right away if: You or a loved one has any signs of a stroke. "BE FAST" is an easy way to remember the warning signs: B - Balance. Dizziness, sudden trouble walking, or loss of balance. E - Eyes. Trouble seeing or a change in how you see. F - Face. Sudden weakness or loss of feeling of the face. The face or eyelid may droop on one side. A - Arms. Weakness or loss of feeling in an arm.  This happens all of a sudden and most often on one side of the body. S - Speech. Sudden trouble speaking, slurred speech, or trouble understanding what people say. T - Time. Time to call emergency services. Write down what time symptoms started. You or a loved one has other signs of a stroke, such as: A sudden, very bad headache with no known cause. Feeling like you may vomit (nausea). Vomiting. A seizure. These symptoms may be an emergency. Get help right away. Call your local emergency services (911 in the [place] Do not wait to see if the symptoms will go away. Do not drive yourself to the hospital. Summary You can help to prevent a stroke by eating healthy, exercising, and not smoking. It also helps to manage any health problems you have. Do not smoke or use any products that contain nicotine or tobacco. Get help right away if you or a loved one has any signs of a stroke. This information is not intended to replace advice given to you by your health care provider. Make sure you discuss any questions you have  with your health care provider. Document Revised: 10/07/2019 Document Reviewed: 10/07/2019 Elsevier Patient Education  [place].

## 2021-10-11 NOTE — Progress Notes (Signed)
[facility] [address] [place]. [place] [address] [phone]       HOSPITAL FOLLOW UP NOTE  [name] Date of Birth:  Sep 02, 1934 Medical Record Number:  [id number]   Reason for Referral:  hospital stroke follow up    SUBJECTIVE:   CHIEF COMPLAINT:  Chief Complaint  Patient presents with   Follow-up    Pt is feeling great. Room 8 with wife and daughter.    HPI:   Robert Webb is a 87 y.o. male with no significant past medical history, does go to yearly medical checkups, who presented to [facility] on 08/18/2021 with acute onset right arm pain and weakness as well as facial droop and right hemianopia.  Personally reviewed hospitalization pertinent progress notes, lab work and imaging.  Evaluated by telemetry neurology, tPA administered, CTA showed left M2 proximal occlusion but as symptoms gradually improving post tPA, IR did not recommend intervention and was transferred to [facility] for further monitoring.  Upon arrival to [facility], found to be in A-fib without prior history.  MRI showed patchy small volume acute initiating infarcts in the left insular/subinsular area and left frontal lobe likely secondary to newly diagnosed A-fib.  MRA showed persistent occlusive thrombus.  EF 45 to 50%.  LDL 78.  A1c 5.5.  Initiated Eliquis 2.5 mg twice daily (age and weight) and Crestor 10 mg daily for secondary stroke prevention measures.  No prior stroke history.  Residual deficits of mild expressive aphasia, mild dysarthria and RUE weakness.  Therapy was recommended outpatient therapy and discharged home on 6/2.   Today, 10/11/2021, patient is being seen for initial hospital follow-up accompanied by his wife and daughter.  He has been doing very well since discharge.  He will have occasional word finding difficulty with prolonged conversation but per wife, this was happening prior to his stroke.  Denies any residual weakness or cognitive impairment.  Evaluated by  therapies at [facility] and was cleared. Does balance and strengthening exercises at home, lives with his wife and daughter lives next door. He has been trying to slowly return back to prior activities although family does limit some activities due to safety concerns, family supervises him 24/7.  Patient questions ongoing need of close supervision and ability to return back to prior activities.  Denies new stroke/TIA symptoms.  Compliant on Eliquis 2.5 mg twice daily as well as Crestor 10 mg daily, denies side effects.  Blood pressure today 146/79. Monitors at home, typically 120s/70s. Has since had follow-up with cardiology Dr. Geraldo Webb and PCP Dr. Delena Webb.   No further concerns at this time      PERTINENT IMAGING  Per hospitalization 08/18/2021 CT no acute abnormality CTA head and neck left M2 thrombus and occlusion MRI- Patchy small volume acute ischemic infarcts involving the left insular/subinsular region as well as the overlying left frontal lobe.  MRA Persistent occlusive thrombus within a proximal left M2 branch just distal to the bifurcation.  2D Echo EF 45 to 50% LDL 78 HgbA1c 5.5    ROS:   14 system review of systems performed and negative with exception of those listed in HPI  PMH:  Past Medical History:  Diagnosis Date   Acute ischemic left MCA stroke ([place]) 08/18/2021   Atrial fibrillation ([place]) 08/20/2021    PSH:  Past Surgical History:  Procedure Laterality Date   CHOLECYSTECTOMY     HERNIA REPAIR      Social History:  Social History   Socioeconomic History  Marital status: Married    Spouse name: Robert Webb   Number of children: 1   Years of education: Not on file   Highest education level: Not on file  Occupational History   Occupation: retired  Tobacco Use   Smoking status: Former    Types: Cigarettes    Passive exposure: Never   Smokeless tobacco: Never   Tobacco comments:    Pt states he smoked when he was in high school   Vaping  Use   Vaping Use: Never used  Substance and Sexual Activity   Alcohol use: Never   Drug use: Never   Sexual activity: Not Currently  Other Topics Concern   Not on file  Social History Narrative   Not on file   Social Determinants of Health   Financial Resource Strain: Low Risk  (08/18/2021)   Overall Financial Resource Strain (CARDIA)    Difficulty of Paying Living Expenses: Not hard at all  Food Insecurity: No Food Insecurity (08/18/2021)   Hunger Vital Sign    Worried About Running Out of Food in the Last Year: Never true    [place] in the Last Year: Never true  Transportation Needs: No Transportation Needs (08/18/2021)   PRAPARE - Hydrologist (Medical): No    Lack of Transportation (Non-Medical): No  Physical Activity: Not on file  Stress: No Stress Concern Present (08/18/2021)   [employer]    Feeling of Stress : Not at all  Social Connections: [place] (08/18/2021)   Social Connection and Isolation Panel [NHANES]    Frequency of Communication with Friends and Family: More than three times a week    Frequency of Social Gatherings with Friends and Family: Three times a week    Attends Religious Services: More than 4 times per year    Active Member of Clubs or Organizations: Yes    Attends Archivist Meetings: More than 4 times per year    Marital Status: Married  Human resources officer Violence: Not At Risk (08/18/2021)   Humiliation, Afraid, Rape, and Kick questionnaire    Fear of Current or Ex-Partner: No    Emotionally Abused: No    Physically Abused: No    Sexually Abused: No    Family History:  Family History  Problem Relation Age of Onset   Cancer Mother    Hypertension Neg Hx    Diabetes Neg Hx    Heart disease Neg Hx     Medications:   Current Outpatient Medications on File Prior to Visit  Medication Sig Dispense Refill   apixaban (ELIQUIS)  2.5 MG TABS tablet Take 1 tablet (2.5 mg total) by mouth 2 (two) times daily. 180 tablet 3   bethanechol (URECHOLINE) 25 MG tablet Take 25 mg by mouth 2 (two) times daily.     dutasteride (AVODART) 0.5 MG capsule Take 0.5 mg by mouth daily.     fexofenadine (ALLEGRA) 180 MG tablet Take 180 mg by mouth as needed for allergies or rhinitis.     Multiple Vitamin (MULTIVITAMIN WITH MINERALS) TABS tablet Take 1 tablet by mouth daily.     Multiple Vitamins-Minerals (PRESERVISION AREDS) TABS Take 1 tablet by mouth daily.     rosuvastatin (CRESTOR) 10 MG tablet Take 1 tablet (10 mg total) by mouth daily. 90 tablet 3   tamsulosin (FLOMAX) 0.4 MG CAPS capsule Take 0.4 mg by mouth daily.     vitamin  C (ASCORBIC ACID) 500 MG tablet Take 500 mg by mouth daily.     Glucosamine-Chondroitin 750-600 MG TABS Take 1 tablet by mouth daily. (Patient not taking: Reported on 09/22/2021)     No current facility-administered medications on file prior to visit.    Allergies:   Allergies  Allergen Reactions   Demerol [Meperidine Hcl] Nausea And Vomiting      OBJECTIVE:  Physical Exam  Vitals:   10/11/21 0952  BP: (!) 146/79  Pulse: 81  Weight: 124 lb 8 oz (56.5 kg)  Height: '5\' 7"'$  (1.702 m)   Body mass index is 19.5 kg/m. No results found.  Poststroke PHQ 2/9    10/11/2021   11:43 AM  Depression screen PHQ 2/9  Decreased Interest 0  Down, Depressed, Hopeless 0  PHQ - 2 Score 0     General: well developed, well nourished, very pleasant elderly Caucasian male, seated, in no evident distress Head: head normocephalic and atraumatic.   Neck: supple with no carotid or supraclavicular bruits Cardiovascular: irregular rate and rhythm, no murmurs Musculoskeletal: no deformity Skin:  no rash/petichiae Vascular:  Normal pulses all extremities   Neurologic Exam Mental Status: Awake and fully alert.  Fluent speech and language.  Oriented to place and time. Recent and remote memory intact. Attention span,  concentration and fund of knowledge appropriate. Mood and affect appropriate.  Cranial Nerves: Fundoscopic exam reveals sharp disc margins. Pupils equal, briskly reactive to light. Extraocular movements full without nystagmus. Visual fields full to confrontation. Hearing intact. Facial sensation intact.  tongue, palate moves normally and symmetrically. Right lower facial weakness (chronic per wife) Motor: Normal bulk and tone. Normal strength in all tested extremity muscles. Mild action tremor of BUE, chronic.  Sensory.: intact to touch , pinprick , position and vibratory sensation.  Coordination: Rapid alternating movements normal in all extremities. Finger-to-nose and heel-to-shin performed accurately bilaterally. Gait and Station: Arises from chair without difficulty. Stance is normal. Gait demonstrates normal stride length and balance without use of AD. Tandem walk and heel toe with mild difficulty.  Romberg negative Reflexes: 1+ and symmetric. Toes downgoing.     NIHSS  1 (facial weakness although chronic per wife) Modified Rankin  1-2      ASSESSMENT: Robert Webb is a 87 y.o. year old male with left MCA scattered patchy infarcts in setting of left M2 occlusion on 08/18/2021 s/p tPA secondary to newly diagnosed A-fib. Vascular risk factors include new dx of A fib, HTN, HLD and advanced age.      PLAN:  Left MCA stroke:  Recovered remarkably well with practically no residual deficits.  Discussed gradual return to prior activities as tolerated, avoid overexertion or prolonged activity in hot weather.  24/7 supervision is likely no longer needed at this time Continue  Eliquis 2.5 mg twice daily  (age and weight) and Crestor 10 mg daily for secondary stroke prevention Discussed secondary stroke prevention measures and importance of close PCP follow up for aggressive stroke risk factor management including BP goal<130/90, HLD with LDL goal<70 and DM with A1c.<7 .  Stroke labs 07/2021:  LDL 78, A1c 5.5 Continue to follow with cardiology for atrial fibrillation and Eliquis management I have gone over the pathophysiology of stroke, warning signs and symptoms, risk factors and their management in some detail with instructions to go to the closest emergency room for symptoms of concern.     Per family/patient request, can follow up on an as-needed basis due to drive to [place].  He follows closely with PCP and cardiology.  Advised to call office if they have any questions or concerns in the future   CC:  [place] provider: Dr. Leonie Man PCP: Nicoletta Dress, MD    I spent 59 minutes of face-to-face and non-face-to-face time with patient and family.  This included previsit chart review including review of recent hospitalization, lab review, study review, electronic health record documentation, patient and family education regarding recent stroke including etiology, secondary stroke prevention measures and importance of managing stroke risk factors, and answered all other questions to patient and family's satisfaction   Frann Rider, AGNP-BC  [facility] [address] [place], [place] [address]  Phone [phone] Fax [phone] Note: This document was prepared with digital dictation and possible smart phrase technology. Any transcriptional errors that result from this process are unintentional.

## 2021-10-21 ENCOUNTER — Other Ambulatory Visit: Payer: Medicare Other

## 2021-10-29 ENCOUNTER — Ambulatory Visit
Admission: RE | Admit: 2021-10-29 | Discharge: 2021-10-29 | Disposition: A | Discharge status: Home / Self Care | Payer: Medicare Other | Source: Ambulatory Visit | Attending: Family Medicine | Admitting: Family Medicine

## 2021-10-29 DIAGNOSIS — C61 Malignant neoplasm of prostate: Secondary | ICD-10-CM

## 2021-10-29 DIAGNOSIS — N3289 Other specified disorders of bladder: Secondary | ICD-10-CM | POA: Diagnosis not present

## 2021-10-29 DIAGNOSIS — N323 Diverticulum of bladder: Secondary | ICD-10-CM | POA: Diagnosis not present

## 2021-10-29 DIAGNOSIS — K5730 Diverticulosis of large intestine without perforation or abscess without bleeding: Secondary | ICD-10-CM | POA: Diagnosis not present

## 2021-10-29 MED ORDER — GADOBENATE DIMEGLUMINE 529 MG/ML IV SOLN
12.0000 mL | Freq: Once | INTRAVENOUS | Status: AC | PRN
  Administered 2021-10-29: 12 mL via INTRAVENOUS

## 2021-11-11 ENCOUNTER — Ambulatory Visit: Payer: Medicare Other | Admitting: Podiatry

## 2021-11-11 ENCOUNTER — Encounter: Payer: Self-pay | Admitting: Podiatry

## 2021-11-11 DIAGNOSIS — M2041 Other hammer toe(s) (acquired), right foot: Secondary | ICD-10-CM | POA: Diagnosis not present

## 2021-11-11 DIAGNOSIS — M2042 Other hammer toe(s) (acquired), left foot: Secondary | ICD-10-CM

## 2021-11-11 DIAGNOSIS — M2011 Hallux valgus (acquired), right foot: Secondary | ICD-10-CM

## 2021-11-11 DIAGNOSIS — M79674 Pain in right toe(s): Secondary | ICD-10-CM

## 2021-11-11 DIAGNOSIS — B351 Tinea unguium: Secondary | ICD-10-CM

## 2021-11-11 DIAGNOSIS — M79675 Pain in left toe(s): Secondary | ICD-10-CM | POA: Diagnosis not present

## 2021-11-11 DIAGNOSIS — M2012 Hallux valgus (acquired), left foot: Secondary | ICD-10-CM | POA: Diagnosis not present

## 2021-11-18 NOTE — Progress Notes (Signed)
Subjective: Robert Webb presents today referred by Nicoletta Dress, MD for complaint of with chief concern of diabetes with elongated, thickened, painful, discolored toenails for several months. Aggravating factor(s) include wearing enclosed shoe gear. Patient has tried none.  His PCP is Dr. Nelda Bucks and last visit was 04/13/2021.  His wife is present during today's visit.  Past Medical History:  Diagnosis Date   Acute ischemic left MCA stroke ([place]) 08/18/2021   Atrial fibrillation ([place]) 08/20/2021    Patient Active Problem List   Diagnosis Date Noted   Mixed dyslipidemia 09/22/2021   Atrial fibrillation ([place]) 08/20/2021   Acute ischemic left MCA stroke ([place]) 08/18/2021     Past Surgical History:  Procedure Laterality Date   CHOLECYSTECTOMY     HERNIA REPAIR       Current Outpatient Medications on File Prior to Visit  Medication Sig Dispense Refill   apixaban (ELIQUIS) 2.5 MG TABS tablet Take 1 tablet (2.5 mg total) by mouth 2 (two) times daily. 180 tablet 3   bethanechol (URECHOLINE) 25 MG tablet Take 25 mg by mouth 2 (two) times daily.     dutasteride (AVODART) 0.5 MG capsule Take 0.5 mg by mouth daily.     fexofenadine (ALLEGRA) 180 MG tablet Take 180 mg by mouth as needed for allergies or rhinitis.     Glucosamine-Chondroitin 750-600 MG TABS Take 1 tablet by mouth daily. (Patient not taking: Reported on 09/22/2021)     Multiple Vitamin (MULTIVITAMIN WITH MINERALS) TABS tablet Take 1 tablet by mouth daily.     Multiple Vitamins-Minerals (PRESERVISION AREDS) TABS Take 1 tablet by mouth daily.     rosuvastatin (CRESTOR) 10 MG tablet Take 1 tablet (10 mg total) by mouth daily. 90 tablet 3   tamsulosin (FLOMAX) 0.4 MG CAPS capsule Take 0.4 mg by mouth daily.     vitamin C (ASCORBIC ACID) 500 MG tablet Take 500 mg by mouth daily.     No current facility-administered medications on file prior to visit.     Allergies  Allergen Reactions   Demerol [Meperidine Hcl]  Nausea And Vomiting     Social History   Occupational History   Occupation: retired  Tobacco Use   Smoking status: Former    Types: Cigarettes    Passive exposure: Never   Smokeless tobacco: Never   Tobacco comments:    Pt states he smoked when he was in high school   Vaping Use   Vaping Use: Never used  Substance and Sexual Activity   Alcohol use: Never   Drug use: Never   Sexual activity: Not Currently     Family History  Problem Relation Age of Onset   Cancer Mother    Hypertension Neg Hx    Diabetes Neg Hx    Heart disease Neg Hx       There is no immunization history on file for this patient.   Objective: There were no vitals filed for this visit.  Robert Webb is a pleasant 87 y.o. male WD, WN in NAD. AAO x 3.  Vascular Examination: Vascular status intact b/l with palpable pedal pulses. Pedal hair present b/l. CFT immediate b/l. No edema. No pain with calf compression b/l. Skin temperature gradient WNL b/l.   Neurological Examination: Sensation grossly intact b/l with 10 gram monofilament. Vibratory sensation intact b/l.   Dermatological Examination: Pedal skin with normal turgor, texture and tone b/l. Toenails 1-5 b/l thick, discolored, elongated with subungual debris and pain on dorsal palpation.  No hyperkeratotic lesions noted b/l.   Musculoskeletal Examination: Muscle strength 5/5 to b/l LE. HAV with bunion deformity noted b/l LE. Crossover hammertoe deformity noted R 2nd toe. Patient ambulates independent of any assistive aids.  Radiographs: None  Last A1c:      Latest Ref Rng & Units 08/18/2021    4:21 AM  Hemoglobin A1C  Hemoglobin-A1c 4.8 - 5.6 % 5.5    Assessment: 1. Pain due to onychomycosis of toenails of both feet   2. Hallux valgus, acquired, bilateral   3. Acquired hammertoes of both feet     Plan: -Patient's family member present. All questions/concerns addressed on today's visit. -Patient to continue soft, supportive shoe gear  daily. -Discussed topical, laser and oral medication for onychomycosis. Patient opted for toenail debridement only on today.  -Mycotic toenails 1-5 bilaterally were debrided in length and girth with sterile nail nippers and dremel without incident. -Patient/POA to call should there be question/concern in the interim.  Return in about 3 months (around 02/11/2022).  Marzetta Board, DPM

## 2021-11-30 ENCOUNTER — Other Ambulatory Visit: Payer: Self-pay

## 2021-11-30 NOTE — Patient Outreach (Signed)
  Care Coordination   11/30/2021 Name: Robert Webb MRN: [id number] DOB: 1935-03-17    Telephone outreach to patient to obtain mRS was successfully completed with wife. MRS= 0  [place] Care Management Assistant [phone]

## 2021-12-28 DIAGNOSIS — Z23 Encounter for immunization: Secondary | ICD-10-CM | POA: Diagnosis not present

## 2022-01-20 DIAGNOSIS — R339 Retention of urine, unspecified: Secondary | ICD-10-CM | POA: Diagnosis not present

## 2022-02-24 ENCOUNTER — Encounter: Payer: Self-pay | Admitting: Podiatry

## 2022-02-24 ENCOUNTER — Ambulatory Visit: Payer: Medicare Other | Admitting: Podiatry

## 2022-02-24 VITALS — BP 149/100

## 2022-02-24 DIAGNOSIS — M79675 Pain in left toe(s): Secondary | ICD-10-CM | POA: Diagnosis not present

## 2022-02-24 DIAGNOSIS — M79674 Pain in right toe(s): Secondary | ICD-10-CM

## 2022-02-24 DIAGNOSIS — B351 Tinea unguium: Secondary | ICD-10-CM

## 2022-03-03 NOTE — Progress Notes (Signed)
  Subjective:  Patient ID: Robert Webb, male    DOB: 1934/11/17,  MRN: [id number]  Robert Webb presents to clinic today for painful thick toenails that are difficult to trim. Pain interferes with ambulation. Aggravating factors include wearing enclosed shoe gear. Pain is relieved with periodic professional debridement.  Chief Complaint  Patient presents with   Nail Problem    [facility] PCP-[facility] Delena Bali PCP VST-09/2021   New problem(s): None.   PCP is Nicoletta Dress, MD.  Allergies  Allergen Reactions   Demerol [Meperidine Hcl] Nausea And Vomiting    Review of Systems: Negative except as noted in the HPI.  Objective: No changes noted in today's physical examination. Vitals:   02/24/22 1532  BP: (!) 149/100   Robert Webb is a pleasant 87 y.o. male WD, WN in NAD. AAO x 3.  Vascular Examination: Vascular status intact b/l with palpable pedal pulses. Pedal hair present b/l. CFT immediate b/l. No edema. No pain with calf compression b/l. Skin temperature gradient WNL b/l.   Neurological Examination: Sensation grossly intact b/l with 10 gram monofilament. Vibratory sensation intact b/l.   Dermatological Examination: Pedal skin with normal turgor, texture and tone b/l. Toenails 1-5 b/l thick, discolored, elongated with subungual debris and pain on dorsal palpation. No hyperkeratotic lesions noted b/l.   Musculoskeletal Examination: Muscle strength 5/5 to b/l LE. HAV with bunion deformity noted b/l LE. Crossover hammertoe deformity noted R 2nd toe. Patient ambulates independent of any assistive aids.  Radiographs: None Assessment/Plan: 1. Pain due to onychomycosis of toenails of both feet     No orders of the defined types were placed in this encounter.   -Consent given for treatment as described below: -Examined patient. -Continue supportive shoe gear daily. -Toenails 1-5 b/l were debrided in length and girth with sterile nail nippers and dremel without iatrogenic  bleeding.  -Patient/POA to call should there be question/concern in the interim.   Return in about 3 months (around 05/26/2022).  Marzetta Board, DPM

## 2022-03-17 DIAGNOSIS — Z961 Presence of intraocular lens: Secondary | ICD-10-CM | POA: Diagnosis not present

## 2022-03-17 DIAGNOSIS — H353121 Nonexudative age-related macular degeneration, left eye, early dry stage: Secondary | ICD-10-CM | POA: Diagnosis not present

## 2022-03-31 DIAGNOSIS — E785 Hyperlipidemia, unspecified: Secondary | ICD-10-CM | POA: Diagnosis not present

## 2022-03-31 DIAGNOSIS — I4891 Unspecified atrial fibrillation: Secondary | ICD-10-CM | POA: Diagnosis not present

## 2022-03-31 DIAGNOSIS — Z79899 Other long term (current) drug therapy: Secondary | ICD-10-CM | POA: Diagnosis not present

## 2022-03-31 DIAGNOSIS — I679 Cerebrovascular disease, unspecified: Secondary | ICD-10-CM | POA: Diagnosis not present

## 2022-03-31 DIAGNOSIS — G8191 Hemiplegia, unspecified affecting right dominant side: Secondary | ICD-10-CM | POA: Diagnosis not present

## 2022-03-31 DIAGNOSIS — R7301 Impaired fasting glucose: Secondary | ICD-10-CM | POA: Diagnosis not present

## 2022-05-31 ENCOUNTER — Ambulatory Visit: Payer: Medicare Other | Admitting: Podiatry

## 2022-05-31 DIAGNOSIS — M79675 Pain in left toe(s): Secondary | ICD-10-CM | POA: Diagnosis not present

## 2022-05-31 DIAGNOSIS — M2042 Other hammer toe(s) (acquired), left foot: Secondary | ICD-10-CM

## 2022-05-31 DIAGNOSIS — M2012 Hallux valgus (acquired), left foot: Secondary | ICD-10-CM

## 2022-05-31 DIAGNOSIS — M79674 Pain in right toe(s): Secondary | ICD-10-CM

## 2022-05-31 DIAGNOSIS — M2041 Other hammer toe(s) (acquired), right foot: Secondary | ICD-10-CM

## 2022-05-31 DIAGNOSIS — B351 Tinea unguium: Secondary | ICD-10-CM | POA: Diagnosis not present

## 2022-05-31 DIAGNOSIS — M2011 Hallux valgus (acquired), right foot: Secondary | ICD-10-CM

## 2022-05-31 NOTE — Progress Notes (Signed)
  Subjective:  Patient ID: Robert Webb, male    DOB: 22-Dec-1934,  MRN: [id number]  Chief Complaint  Patient presents with   Nail Problem    Routine Foot Care    88 y.o. male presents with the above complaint. History confirmed with patient. Patient presenting with pain related to dystrophic thickened elongated nails. Patient is unable to trim own nails related to nail dystrophy and/or mobility issues. Patient does not have a history of T2DM.   Objective:  Physical Exam: warm, good capillary refill nail exam onychomycosis of the toenails, onycholysis, and dystrophic nails DP pulses palpable, PT pulses palpable, and protective sensation intact Left Foot:  Pain with palpation of nails due to elongation and dystrophic growth.  Right Foot: Pain with palpation of nails due to elongation and dystrophic growth.   Assessment:   1. Pain due to onychomycosis of toenails of both feet   2. Hallux valgus, acquired, bilateral   3. Acquired hammertoes of both feet      Plan:  Patient was evaluated and treated and all questions answered.   #Onychomycosis with pain  -Nails palliatively debrided as below. -Educated on self-care  Procedure: Nail Debridement Rationale: Pain Type of Debridement: manual, sharp debridement. Instrumentation: Nail nipper, rotary burr. Number of Nails: 10  Return in about 3 months (around 08/31/2022) for [facility].         Everitt Amber, DPM [facility]

## 2022-06-23 DIAGNOSIS — R339 Retention of urine, unspecified: Secondary | ICD-10-CM | POA: Diagnosis not present

## 2022-08-23 ENCOUNTER — Ambulatory Visit: Payer: Medicare Other | Admitting: Podiatry

## 2022-08-23 DIAGNOSIS — M79675 Pain in left toe(s): Secondary | ICD-10-CM | POA: Diagnosis not present

## 2022-08-23 DIAGNOSIS — B351 Tinea unguium: Secondary | ICD-10-CM | POA: Diagnosis not present

## 2022-08-23 DIAGNOSIS — M79674 Pain in right toe(s): Secondary | ICD-10-CM | POA: Diagnosis not present

## 2022-08-23 NOTE — Progress Notes (Signed)
  Subjective:  Patient ID: Robert Webb, male    DOB: 1934/07/10,  MRN: [id number]  Chief Complaint  Patient presents with   Nail Problem    Diabetic Foot Care     88 y.o. male presents with the above complaint. History confirmed with patient. Patient presenting with pain related to dystrophic thickened elongated nails. Patient is unable to trim own nails related to nail dystrophy and/or mobility issues. Patient does not have a history of T2DM.   Objective:  Physical Exam: warm, good capillary refill nail exam onychomycosis of the toenails, onycholysis, and dystrophic nails DP pulses palpable, PT pulses palpable, and protective sensation intact Left Foot:  Pain with palpation of nails due to elongation and dystrophic growth.  Right Foot: Pain with palpation of nails due to elongation and dystrophic growth.   Assessment:   1. Pain due to onychomycosis of toenails of both feet     Plan:  Patient was evaluated and treated and all questions answered.   #Onychomycosis with pain  -Nails palliatively debrided as below. -Educated on self-care  Procedure: Nail Debridement Rationale: Pain Type of Debridement: manual, sharp debridement. Instrumentation: Nail nipper, rotary burr. Number of Nails: 10  Return in about 3 months (around 11/23/2022) for [facility].         Corinna Gab, DPM [facility]

## 2022-08-24 DIAGNOSIS — J309 Allergic rhinitis, unspecified: Secondary | ICD-10-CM | POA: Diagnosis not present

## 2022-09-02 ENCOUNTER — Ambulatory Visit: Payer: Medicare Other | Attending: Cardiology | Admitting: Cardiology

## 2022-09-02 ENCOUNTER — Encounter: Payer: Self-pay | Admitting: Cardiology

## 2022-09-02 VITALS — BP 130/76 | HR 72 | Ht 67.0 in | Wt 129.4 lb

## 2022-09-02 DIAGNOSIS — I63512 Cerebral infarction due to unspecified occlusion or stenosis of left middle cerebral artery: Secondary | ICD-10-CM | POA: Diagnosis not present

## 2022-09-02 DIAGNOSIS — E782 Mixed hyperlipidemia: Secondary | ICD-10-CM

## 2022-09-02 DIAGNOSIS — I4891 Unspecified atrial fibrillation: Secondary | ICD-10-CM

## 2022-09-02 MED ORDER — ROSUVASTATIN CALCIUM 10 MG PO TABS: 10.0000 mg | ORAL_TABLET | Freq: Every day | ORAL | 3 refills | Status: DC

## 2022-09-02 MED ORDER — APIXABAN 2.5 MG PO TABS: 2.5000 mg | ORAL_TABLET | Freq: Two times a day (BID) | ORAL | 3 refills | Status: DC

## 2022-09-02 MED ORDER — APIXABAN 2.5 MG PO TABS: 2.5000 mg | ORAL_TABLET | Freq: Two times a day (BID) | ORAL | 0 refills | Status: DC

## 2022-09-02 NOTE — Patient Instructions (Signed)
Medication Instructions:  Your physician recommends that you continue on your current medications as directed. Please refer to the Current Medication list given to you today.  *If you need a refill on your cardiac medications before your next appointment, please call your pharmacy*   Lab Work: None ordered If you have labs (blood work) drawn today and your tests are completely normal, you will receive your results only by: MyChart Message (if you have MyChart) OR A paper copy in the mail If you have any lab test that is abnormal or we need to change your treatment, we will call you to review the results.   Testing/Procedures: None ordered   Follow-Up: At [facility], you and your health needs are our priority.  As part of our continuing mission to provide you with exceptional heart care, we have created designated Provider Care Teams.  These Care Teams include your primary Cardiologist (physician) and Advanced Practice Providers (APPs -  Physician Assistants and Nurse Practitioners) who all work together to provide you with the care you need, when you need it.  We recommend signing up for the patient portal called "MyChart".  Sign up information is provided on this After Visit Summary.  MyChart is used to connect with patients for Virtual Visits (Telemedicine).  Patients are able to view lab/test results, encounter notes, upcoming appointments, etc.  Non-urgent messages can be sent to your provider as well.   To learn more about what you can do with MyChart, go to [email or web address]://[email or web address].com.    Your next appointment:   9 month(s)  The format for your next appointment:   In Person  Provider:   Rajan Revankar, MD    Other Instructions none  Important Information About Sugar       

## 2022-09-02 NOTE — Addendum Note (Signed)
Addended by: Eleonore Chiquito on: 09/02/2022 03:00 PM   Modules accepted: Orders

## 2022-09-02 NOTE — Progress Notes (Signed)
Cardiology Office Note:    Date:  09/02/2022   ID:  Robert Webb, DOB Jun 12, 1934, MRN [id number]  PCP:  Paulina Fusi, MD  Cardiologist:  Garwin Brothers, MD   Referring MD: Paulina Fusi, MD    ASSESSMENT:    1. Atrial fibrillation, unspecified type (HCC)   2. Mixed dyslipidemia   3. Acute ischemic left MCA stroke (HCC)    PLAN:    In order of problems listed above:  Primary prevention stressed with the patient.  Importance of compliance with diet medication stressed and patient verbalized standing. Atrial fibrillation:I discussed with the patient atrial fibrillation, disease process. Management and therapy including rate and rhythm control, anticoagulation benefits and potential risks were discussed extensively with the patient. Patient had multiple questions which were answered to patient's satisfaction. Mixed dyslipidemia: On lipid-lowering medications followed by primary care.  Lipids were reviewed and found to be fine. History of stroke but was thrombolysis: Patient has had very good recovery of motor function.  He was advised to ambulate to the best of his ability.  He is an active gentleman. Patient will be seen in follow-up appointment in 6 months or earlier if the patient has any concerns.   Medication Adjustments/Labs and Tests Ordered: Current medicines are reviewed at length with the patient today.  Concerns regarding medicines are outlined above.  No orders of the defined types were placed in this encounter.  No orders of the defined types were placed in this encounter.    No chief complaint on file.    History of Present Illness:    Robert Webb is a 88 y.o. male.  Patient has past medical history of atrial fibrillation, mixed dyslipidemia and history of stroke.  He denies any problems at this time and takes care of activities of daily living.  No chest pain orthopnea or PND.  His wife accompanies him for this visit.  At the time of my  evaluation, the patient is alert awake oriented and in no distress.  Past Medical History:  Diagnosis Date   Acute ischemic left MCA stroke (HCC) 08/18/2021   Atrial fibrillation (HCC) 08/20/2021   Mixed dyslipidemia 09/22/2021    Past Surgical History:  Procedure Laterality Date   CHOLECYSTECTOMY     HERNIA REPAIR      Current Medications: Current Meds  Medication Sig   apixaban (ELIQUIS) 2.5 MG TABS tablet Take 1 tablet (2.5 mg total) by mouth 2 (two) times daily.   bethanechol (URECHOLINE) 25 MG tablet Take 25 mg by mouth 2 (two) times daily.   dutasteride (AVODART) 0.5 MG capsule Take 0.5 mg by mouth daily.   rosuvastatin (CRESTOR) 10 MG tablet Take 1 tablet (10 mg total) by mouth daily.   tamsulosin (FLOMAX) 0.4 MG CAPS capsule Take 0.4 mg by mouth daily.     Allergies:   Demerol [meperidine hcl]   Social History   Socioeconomic History   Marital status: Married    Spouse name: Daivon Perfetti   Number of children: 1   Years of education: Not on file   Highest education level: Not on file  Occupational History   Occupation: retired  Tobacco Use   Smoking status: Former    Types: Cigarettes    Passive exposure: Never   Smokeless tobacco: Never   Tobacco comments:    Pt states he smoked when he was in high school   Vaping Use   Vaping Use: Never used  Substance and Sexual Activity  Alcohol use: Never   Drug use: Never   Sexual activity: Not Currently  Other Topics Concern   Not on file  Social History Narrative   Not on file   Social Determinants of Health   Financial Resource Strain: Low Risk  (08/18/2021)   Overall Financial Resource Strain (CARDIA)    Difficulty of Paying Living Expenses: Not hard at all  Food Insecurity: No Food Insecurity (08/18/2021)   Hunger Vital Sign    Worried About Running Out of Food in the Last Year: Never true    Ran Out of Food in the Last Year: Never true  Transportation Needs: No Transportation Needs (08/18/2021)    PRAPARE - Administrator, Civil Service (Medical): No    Lack of Transportation (Non-Medical): No  Physical Activity: Not on file  Stress: No Stress Concern Present (08/18/2021)   [employer] of Occupational Health - Occupational Stress Questionnaire    Feeling of Stress : Not at all  Social Connections: Socially Integrated (08/18/2021)   Social Connection and Isolation Panel [NHANES]    Frequency of Communication with Friends and Family: More than three times a week    Frequency of Social Gatherings with Friends and Family: Three times a week    Attends Religious Services: More than 4 times per year    Active Member of Clubs or Organizations: Yes    Attends Engineer, structural: More than 4 times per year    Marital Status: Married     Family History: The patient's family history includes Cancer in his mother. There is no history of Hypertension, Diabetes, or Heart disease.  ROS:   Please see the history of present illness.    All other systems reviewed and are negative.  EKGs/Labs/Other Studies Reviewed:    The following studies were reviewed today: I discussed my findings with the patient at length.   Recent Labs: No results found for requested labs within last 365 days.  Recent Lipid Panel    Component Value Date/Time   CHOL 132 08/18/2021 0421   TRIG 20 08/18/2021 0421   HDL 50 08/18/2021 0421   CHOLHDL 2.6 08/18/2021 0421   VLDL 4 08/18/2021 0421   LDLCALC 78 08/18/2021 0421    Physical Exam:    VS:  BP 130/76   Pulse 72   Ht 5\' 7"  (1.702 m)   Wt 129 lb 6.4 oz (58.7 kg)   SpO2 96%   BMI 20.27 kg/m     Wt Readings from Last 3 Encounters:  09/02/22 129 lb 6.4 oz (58.7 kg)  10/11/21 124 lb 8 oz (56.5 kg)  09/22/21 123 lb (55.8 kg)     GEN: Patient is in no acute distress HEENT: Normal NECK: No JVD; No carotid bruits LYMPHATICS: No lymphadenopathy CARDIAC: Hear sounds regular, 2/6 systolic murmur at the apex. RESPIRATORY:   Clear to auscultation without rales, wheezing or rhonchi  ABDOMEN: Soft, non-tender, non-distended MUSCULOSKELETAL:  No edema; No deformity  SKIN: Warm and dry NEUROLOGIC:  Alert and oriented x 3 PSYCHIATRIC:  Normal affect   Signed, Garwin Brothers, MD  09/02/2022 2:07 PM    [facility]

## 2022-09-02 NOTE — Progress Notes (Signed)
Eliquis dose 2.5 mg remains the same as the pt is greater than 88 yo and kg less than 60.

## 2022-09-08 ENCOUNTER — Telehealth: Payer: Self-pay

## 2022-09-08 NOTE — Telephone Encounter (Signed)
Left voice mail for patient to call back about his Eliquis patient assistance he turned in recently.  We are missing the number of people living in the household.  Gave patient my direct line to call back.

## 2022-09-08 NOTE — Telephone Encounter (Signed)
Patient's wife called and confirming there are 2 people living in the household.

## 2022-09-23 ENCOUNTER — Telehealth: Payer: Self-pay | Admitting: Cardiology

## 2022-09-23 NOTE — Telephone Encounter (Signed)
Pt c/o medication issue:  1. Name of Medication: apixaban (ELIQUIS) 2.5 MG TABS tablet   2. How are you currently taking this medication (dosage and times per day)?    3. Are you having a reaction (difficulty breathing--STAT)? no  4. What is your medication issue? Wife calling to say that patient is in the donut hole. Calling to see what their options are and if she needs to go pick up prescription. Please advise

## 2022-09-23 NOTE — Telephone Encounter (Signed)
Spoke with wife who states that the pt has 2 RX to pick up that are 289.00 total. Advised to bring print out by the office. Shelby verbalized understanding and had no additional questions.

## 2022-09-23 NOTE — Telephone Encounter (Signed)
Detailed info left with the following information on VM as per DPR.  Call [phone]) from Monday - Friday, 8 AM - 8 PM (ET). Live specialists are here to: Help you find out if Robert Webb is covered by your insurance plan. Determine if you are eligible for assistance paying for ELIQUIS.  Called pt assistance and pt needs to spend $225.00 out of pocket to qualify.

## 2022-09-29 DIAGNOSIS — I679 Cerebrovascular disease, unspecified: Secondary | ICD-10-CM | POA: Diagnosis not present

## 2022-09-29 DIAGNOSIS — E785 Hyperlipidemia, unspecified: Secondary | ICD-10-CM | POA: Diagnosis not present

## 2022-09-29 DIAGNOSIS — Z79899 Other long term (current) drug therapy: Secondary | ICD-10-CM | POA: Diagnosis not present

## 2022-09-29 DIAGNOSIS — R7301 Impaired fasting glucose: Secondary | ICD-10-CM | POA: Diagnosis not present

## 2022-09-29 DIAGNOSIS — G8191 Hemiplegia, unspecified affecting right dominant side: Secondary | ICD-10-CM | POA: Diagnosis not present

## 2022-10-25 ENCOUNTER — Ambulatory Visit: Payer: Medicare Other | Admitting: Podiatry

## 2022-11-07 ENCOUNTER — Ambulatory Visit (INDEPENDENT_AMBULATORY_CARE_PROVIDER_SITE_OTHER): Payer: Medicare Other | Admitting: Podiatry

## 2022-11-07 DIAGNOSIS — B351 Tinea unguium: Secondary | ICD-10-CM

## 2022-11-07 DIAGNOSIS — M79675 Pain in left toe(s): Secondary | ICD-10-CM

## 2022-11-07 DIAGNOSIS — M2011 Hallux valgus (acquired), right foot: Secondary | ICD-10-CM

## 2022-11-07 DIAGNOSIS — M79674 Pain in right toe(s): Secondary | ICD-10-CM | POA: Diagnosis not present

## 2022-11-07 DIAGNOSIS — M2012 Hallux valgus (acquired), left foot: Secondary | ICD-10-CM

## 2022-11-07 NOTE — Progress Notes (Signed)
  Subjective:  Patient ID: Robert Webb, male    DOB: [birth year],  MRN: [id number]  Chief Complaint  Patient presents with   Nail Problem    DFC    88 y.o. male presents with the above complaint. History confirmed with patient. Patient presenting with pain related to dystrophic thickened elongated nails. Patient is unable to trim own nails related to nail dystrophy and/or mobility issues. Patient does not have a history of T2DM.   Objective:  Physical Exam: warm, good capillary refill nail exam onychomycosis of the toenails, onycholysis, and dystrophic nails DP pulses palpable, PT pulses palpable, and protective sensation intact Left Foot:  Pain with palpation of nails due to elongation and dystrophic growth.  Right Foot: Pain with palpation of nails due to elongation and dystrophic growth.   Assessment:   1. Pain due to onychomycosis of toenails of both feet   2. Hallux valgus, acquired, bilateral      Plan:  Patient was evaluated and treated and all questions answered.   #Onychomycosis with pain  -Nails palliatively debrided as below. -Educated on self-care  Procedure: Nail Debridement Rationale: Pain Type of Debridement: manual, sharp debridement. Instrumentation: Nail nipper, rotary burr. Number of Nails: 10  No follow-ups on file.         Corinna Gab, DPM [facility] / [facility]

## 2022-12-22 DIAGNOSIS — R339 Retention of urine, unspecified: Secondary | ICD-10-CM | POA: Diagnosis not present

## 2023-01-16 DIAGNOSIS — Z0000 Encounter for general adult medical examination without abnormal findings: Secondary | ICD-10-CM | POA: Diagnosis not present

## 2023-01-16 DIAGNOSIS — Z9181 History of falling: Secondary | ICD-10-CM | POA: Diagnosis not present

## 2023-02-07 ENCOUNTER — Ambulatory Visit: Payer: Medicare Other | Admitting: Podiatry

## 2023-02-07 DIAGNOSIS — B351 Tinea unguium: Secondary | ICD-10-CM | POA: Diagnosis not present

## 2023-02-07 DIAGNOSIS — M79674 Pain in right toe(s): Secondary | ICD-10-CM

## 2023-02-07 DIAGNOSIS — M79675 Pain in left toe(s): Secondary | ICD-10-CM | POA: Diagnosis not present

## 2023-02-07 NOTE — Progress Notes (Signed)
  Subjective:  Patient ID: Robert Webb, male    DOB: 1934-10-30,  MRN: [id number]  Chief Complaint  Patient presents with   Foot Care    Not diabetic. Takes Eliquis bid. No other concerns, just needs nails trimmed.     88 y.o. male presents with the above complaint. History confirmed with patient. Patient presenting with pain related to dystrophic thickened elongated nails. Patient is unable to trim own nails related to nail dystrophy and/or mobility issues. Patient does not have a history of T2DM.   Objective:  Physical Exam: warm, good capillary refill nail exam onychomycosis of the toenails, onycholysis, and dystrophic nails DP pulses palpable, PT pulses palpable, and protective sensation intact Left Foot:  Pain with palpation of nails due to elongation and dystrophic growth.  Right Foot: Pain with palpation of nails due to elongation and dystrophic growth.   Assessment:   1. Pain due to onychomycosis of toenails of both feet       Plan:  Patient was evaluated and treated and all questions answered.   #Onychomycosis with pain  -Nails palliatively debrided as below. -Educated on self-care  Procedure: Nail Debridement Rationale: Pain Type of Debridement: manual, sharp debridement. Instrumentation: Nail nipper, rotary burr. Number of Nails: 10  Return in about 3 months (around 05/10/2023) for RFC.         Corinna Gab, DPM [facility] / [facility]

## 2023-04-06 DIAGNOSIS — H524 Presbyopia: Secondary | ICD-10-CM | POA: Diagnosis not present

## 2023-04-06 DIAGNOSIS — H353121 Nonexudative age-related macular degeneration, left eye, early dry stage: Secondary | ICD-10-CM | POA: Diagnosis not present

## 2023-04-13 DIAGNOSIS — E785 Hyperlipidemia, unspecified: Secondary | ICD-10-CM | POA: Diagnosis not present

## 2023-04-13 DIAGNOSIS — R7301 Impaired fasting glucose: Secondary | ICD-10-CM | POA: Diagnosis not present

## 2023-04-13 DIAGNOSIS — Z0000 Encounter for general adult medical examination without abnormal findings: Secondary | ICD-10-CM | POA: Diagnosis not present

## 2023-05-11 ENCOUNTER — Ambulatory Visit: Payer: Medicare Other | Admitting: Podiatry

## 2023-05-18 ENCOUNTER — Ambulatory Visit: Payer: Medicare Other | Admitting: Podiatry

## 2023-05-18 DIAGNOSIS — B351 Tinea unguium: Secondary | ICD-10-CM

## 2023-05-18 DIAGNOSIS — M79675 Pain in left toe(s): Secondary | ICD-10-CM

## 2023-05-18 DIAGNOSIS — M79674 Pain in right toe(s): Secondary | ICD-10-CM

## 2023-05-18 NOTE — Progress Notes (Signed)
       Subjective:  Patient ID: Robert Webb, male    DOB: 11/07/1934,  MRN: [id number]   Robert Webb presents to clinic today for:  Chief Complaint  Patient presents with   RFC    RFC no callous. Not diabetic takes Elliquis.   Patient notes nails are thick, discolored, elongated and painful in shoegear when trying to ambulate.    PCP is Paulina Fusi, MD.  Past Medical History:  Diagnosis Date   Acute ischemic left MCA stroke (HCC) 08/18/2021   Atrial fibrillation (HCC) 08/20/2021   Mixed dyslipidemia 09/22/2021    Past Surgical History:  Procedure Laterality Date   CHOLECYSTECTOMY     HERNIA REPAIR      Allergies  Allergen Reactions   Demerol [Meperidine Hcl] Nausea And Vomiting    Review of Systems: Negative except as noted in the HPI.  Objective:  Robert Webb is a pleasant 89 y.o. male in NAD. AAO x 3.  Vascular Examination: Capillary refill time is 3-5 seconds to toes bilateral. Palpable pedal pulses b/l LE. Digital hair present b/l.  Skin temperature gradient WNL b/l. No varicosities b/l. No cyanosis noted b/l.   Dermatological Examination: Pedal skin with normal turgor, texture and tone b/l. No open wounds. No interdigital macerations b/l. Toenails x10 are 3mm thick, discolored, dystrophic with subungual debris. There is pain with compression of the nail plates.  They are elongated x10  Assessment/Plan: 1. Pain due to onychomycosis of toenails of both feet    The mycotic toenails were sharply debrided x10 with sterile nail nippers and a power debriding burr to decrease bulk/thickness and length.    Return in about 10 weeks (around 07/27/2023) for RFC.   Clerance Lav, DPM, FACFAS [facility]     [address] [place], [place] [address]                Office [phone]  Fax [phone]

## 2023-06-01 ENCOUNTER — Encounter: Payer: Self-pay | Admitting: Cardiology

## 2023-06-01 ENCOUNTER — Ambulatory Visit: Payer: Medicare Other | Attending: Cardiology | Admitting: Cardiology

## 2023-06-01 VITALS — BP 130/80 | HR 77 | Ht 67.0 in | Wt 128.0 lb

## 2023-06-01 DIAGNOSIS — E782 Mixed hyperlipidemia: Secondary | ICD-10-CM

## 2023-06-01 DIAGNOSIS — Z8673 Personal history of transient ischemic attack (TIA), and cerebral infarction without residual deficits: Secondary | ICD-10-CM | POA: Insufficient documentation

## 2023-06-01 DIAGNOSIS — I4891 Unspecified atrial fibrillation: Secondary | ICD-10-CM

## 2023-06-01 DIAGNOSIS — R011 Cardiac murmur, unspecified: Secondary | ICD-10-CM | POA: Diagnosis not present

## 2023-06-01 HISTORY — DX: Personal history of transient ischemic attack (TIA), and cerebral infarction without residual deficits: Z86.73

## 2023-06-01 NOTE — Progress Notes (Signed)
 Cardiology Office Note:    Date:  06/01/2023   ID:  Robert Webb, DOB [birth year], MRN [id number]  PCP:  Paulina Fusi, MD  Cardiologist:  Garwin Brothers, MD   Referring MD: Paulina Fusi, MD    ASSESSMENT:    1. Atrial fibrillation, unspecified type (HCC)   2. Mixed dyslipidemia   3. History of stroke    PLAN:    In order of problems listed above:  Primary prevention stressed with the patient.  Importance of compliance with diet medication stressed and patient verbalized standing. Patient was advised to ambulate to the best of his ability and he seems to be doing well with this. Persistent atrial fibrillation:I discussed with the patient atrial fibrillation, disease process. Management and therapy including rate and rhythm control, anticoagulation benefits and potential risks were discussed extensively with the patient. Patient had multiple questions which were answered to patient's satisfaction. Mixed dyslipidemia: On lipid-lowering medications followed by primary care.  Diet emphasized Patient will be seen in follow-up appointment in 12 months or earlier if the patient has any concerns.    Medication Adjustments/Labs and Tests Ordered: Current medicines are reviewed at length with the patient today.  Concerns regarding medicines are outlined above.  Orders Placed This Encounter  Procedures   EKG 12-Lead   No orders of the defined types were placed in this encounter.    Chief Complaint  Patient presents with   Follow-up     History of Present Illness:    Robert Webb is a 89 y.o. male.  Patient has past medical history of atrial fibrillation on anticoagulation, mixed dyslipidemia and history of stroke.  She denies any problems at this time and takes care of activities of daily living.  No chest pain orthopnea or PND.  At the time of my evaluation, the patient is alert awake oriented and in no distress.  His wife accompanies him for this visit.  Past  Medical History:  Diagnosis Date   Acute ischemic left MCA stroke (HCC) 08/18/2021   Atrial fibrillation (HCC) 08/20/2021   Mixed dyslipidemia 09/22/2021    Past Surgical History:  Procedure Laterality Date   CHOLECYSTECTOMY     HERNIA REPAIR      Current Medications: Current Meds  Medication Sig   apixaban (ELIQUIS) 2.5 MG TABS tablet Take 1 tablet (2.5 mg total) by mouth 2 (two) times daily.   bethanechol (URECHOLINE) 25 MG tablet Take 25 mg by mouth 2 (two) times daily.   dutasteride (AVODART) 0.5 MG capsule Take 0.5 mg by mouth daily.   rosuvastatin (CRESTOR) 10 MG tablet Take 1 tablet (10 mg total) by mouth daily.   tamsulosin (FLOMAX) 0.4 MG CAPS capsule Take 0.4 mg by mouth daily.     Allergies:   Demerol [meperidine hcl]   Social History   Socioeconomic History   Marital status: Married    Spouse name: Vonnie Ligman   Number of children: 1   Years of education: Not on file   Highest education level: Not on file  Occupational History   Occupation: retired  Tobacco Use   Smoking status: Former    Types: Cigarettes    Passive exposure: Never   Smokeless tobacco: Never   Tobacco comments:    Pt states he smoked when he was in high school   Vaping Use   Vaping status: Never Used  Substance and Sexual Activity   Alcohol use: Never   Drug use: Never   Sexual activity:  Not Currently  Other Topics Concern   Not on file  Social History Narrative   Not on file   Social Drivers of Health   Financial Resource Strain: Low Risk  (08/18/2021)   Overall Financial Resource Strain (CARDIA)    Difficulty of Paying Living Expenses: Not hard at all  Food Insecurity: No Food Insecurity (08/18/2021)   Hunger Vital Sign    Worried About Running Out of Food in the Last Year: Never true    Ran Out of Food in the Last Year: Never true  Transportation Needs: No Transportation Needs (08/18/2021)   PRAPARE - Administrator, Civil Service (Medical): No    Lack of  Transportation (Non-Medical): No  Physical Activity: Not on file  Stress: No Stress Concern Present (08/18/2021)   [employer] of Occupational Health - Occupational Stress Questionnaire    Feeling of Stress : Not at all  Social Connections: Socially Integrated (08/18/2021)   Social Connection and Isolation Panel [NHANES]    Frequency of Communication with Friends and Family: More than three times a week    Frequency of Social Gatherings with Friends and Family: Three times a week    Attends Religious Services: More than 4 times per year    Active Member of Clubs or Organizations: Yes    Attends Engineer, structural: More than 4 times per year    Marital Status: Married     Family History: The patient's family history includes Cancer in his mother. There is no history of Hypertension, Diabetes, or Heart disease.  ROS:   Please see the history of present illness.    All other systems reviewed and are negative.  EKGs/Labs/Other Studies Reviewed:    The following studies were reviewed today: .Marland KitchenEKG Interpretation Date/Time:  Thursday June 01 2023 13:09:17 EDT Ventricular Rate:  77 PR Interval:    QRS Duration:  106 QT Interval:  412 QTC Calculation: 466 R Axis:   263  Text Interpretation: Atrial fibrillation with premature ventricular or aberrantly conducted complexes Right superior axis deviation Septal infarct (cited on or before 18-Aug-2021) Abnormal ECG When compared with ECG of 18-Aug-2021 03:05, Questionable change in QRS axis Confirmed by Belva Crome [phone]) on 06/01/2023 1:34:32 PM  Thank you   Recent Labs: No results found for requested labs within last 365 days.  Recent Lipid Panel    Component Value Date/Time   CHOL 132 08/18/2021 0421   TRIG 20 08/18/2021 0421   HDL 50 08/18/2021 0421   CHOLHDL 2.6 08/18/2021 0421   VLDL 4 08/18/2021 0421   LDLCALC 78 08/18/2021 0421    Physical Exam:    VS:  BP 130/80   Pulse 77   Ht 5\' 7"  (1.702 m)    Wt 128 lb (58.1 kg)   SpO2 96%   BMI 20.05 kg/m     Wt Readings from Last 3 Encounters:  06/01/23 128 lb (58.1 kg)  09/02/22 129 lb 6.4 oz (58.7 kg)  10/11/21 124 lb 8 oz (56.5 kg)     GEN: Patient is in no acute distress HEENT: Normal NECK: No JVD; No carotid bruits LYMPHATICS: No lymphadenopathy CARDIAC: Hear sounds regular, 2/6 systolic murmur at the apex. RESPIRATORY:  Clear to auscultation without rales, wheezing or rhonchi  ABDOMEN: Soft, non-tender, non-distended MUSCULOSKELETAL:  No edema; No deformity  SKIN: Warm and dry NEUROLOGIC:  Alert and oriented x 3 PSYCHIATRIC:  Normal affect   Signed, Garwin Brothers, MD  06/01/2023 1:41 PM  [facility]

## 2023-06-01 NOTE — Patient Instructions (Addendum)
 Medication Instructions:  Your physician recommends that you continue on your current medications as directed. Please refer to the Current Medication list given to you today.  *If you need a refill on your cardiac medications before your next appointment, please call your pharmacy*   Lab Work: None Ordered If you have labs (blood work) drawn today and your tests are completely normal, you will receive your results only by: MyChart Message (if you have MyChart) OR A paper copy in the mail If you have any lab test that is abnormal or we need to change your treatment, we will call you to review the results.   Testing/Procedures: Echocardiogram An echocardiogram is a test that uses sound waves (ultrasound) to produce images of the heart. Images from an echocardiogram can provide important information about: Heart size and shape. The size and thickness and movement of your heart's walls. Heart muscle function and strength. Heart valve function or if you have stenosis. Stenosis is when the heart valves are too narrow. If blood is flowing backward through the heart valves (regurgitation). A tumor or infectious growth around the heart valves. Areas of heart muscle that are not working well because of poor blood flow or injury from a heart attack. Aneurysm detection. An aneurysm is a weak or damaged part of an artery wall. The wall bulges out from the normal force of blood pumping through the body. Tell a health care provider about: Any allergies you have. All medicines you are taking, including vitamins, herbs, eye drops, creams, and over-the-counter medicines. Any blood disorders you have. Any surgeries you have had. Any medical conditions you have. Whether you are pregnant or may be pregnant. What are the risks? Generally, this is a safe test. However, problems may occur, including an allergic reaction to dye (contrast) that may be used during the test. What happens before the test? No  specific preparation is needed. You may eat and drink normally. What happens during the test?  You will take off your clothes from the waist up and put on a hospital gown. Electrodes or electrocardiogram (ECG)patches may be placed on your chest. The electrodes or patches are then connected to a device that monitors your heart rate and rhythm. You will lie down on a table for an ultrasound exam. A gel will be applied to your chest to help sound waves pass through your skin. A handheld device, called a transducer, will be pressed against your chest and moved over your heart. The transducer produces sound waves that travel to your heart and bounce back (or "echo" back) to the transducer. These sound waves will be captured in real-time and changed into images of your heart that can be viewed on a video monitor. The images will be recorded on a computer and reviewed by your health care provider. You may be asked to change positions or hold your breath for a short time. This makes it easier to get different views or better views of your heart. In some cases, you may receive contrast through an IV in one of your veins. This can improve the quality of the pictures from your heart. The procedure may vary among health care providers and hospitals. What can I expect after the test? You may return to your normal, everyday life, including diet, activities, and medicines, unless your health care provider tells you not to do that. Follow these instructions at home: It is up to you to get the results of your test. Ask your health care provider,  or the department that is doing the test, when your results will be ready. Keep all follow-up visits. This is important. Summary An echocardiogram is a test that uses sound waves (ultrasound) to produce images of the heart. Images from an echocardiogram can provide important information about the size and shape of your heart, heart muscle function, heart valve function, and  other possible heart problems. You do not need to do anything to prepare before this test. You may eat and drink normally. After the echocardiogram is completed, you may return to your normal, everyday life, unless your health care provider tells you not to do that. This information is not intended to replace advice given to you by your health care provider. Make sure you discuss any questions you have with your health care provider. Document Revised: 11/18/2020 Document Reviewed: 10/29/2019 Elsevier Patient Education  2023 Elsevier Inc.       Follow-Up: At [facility], you and your health needs are our priority.  As part of our continuing mission to provide you with exceptional heart care, we have created designated Provider Care Teams.  These Care Teams include your primary Cardiologist (physician) and Advanced Practice Providers (APPs -  Physician Assistants and Nurse Practitioners) who all work together to provide you with the care you need, when you need it.  We recommend signing up for the patient portal called "MyChart".  Sign up information is provided on this After Visit Summary.  MyChart is used to connect with patients for Virtual Visits (Telemedicine).  Patients are able to view lab/test results, encounter notes, upcoming appointments, etc.  Non-urgent messages can be sent to your provider as well.   To learn more about what you can do with MyChart, go to [email or web address].    Your next appointment:   12 month follow up

## 2023-06-08 ENCOUNTER — Other Ambulatory Visit: Payer: Self-pay

## 2023-06-08 ENCOUNTER — Telehealth: Payer: Self-pay

## 2023-06-08 MED ORDER — APIXABAN 2.5 MG PO TABS: 2.5000 mg | ORAL_TABLET | Freq: Two times a day (BID) | ORAL | Status: DC

## 2023-06-09 ENCOUNTER — Telehealth: Payer: Self-pay | Admitting: Pharmacy Technician

## 2023-06-09 NOTE — Telephone Encounter (Signed)
 Patient wife called back and I advised her that the only option is [employer] and she would have to submit her expense report that would have to be 3 percent of their yearly household income. They said they didn't qualify to middle/end of the year for the assistance. I also told her she can call her back of her card to do a medicare payment plan.

## 2023-06-09 NOTE — Telephone Encounter (Signed)
 Marland Kitchen

## 2023-06-09 NOTE — Telephone Encounter (Signed)
 Received this message: Robert Webb, CMA "Patient is on 2.5mg  of Eliquis and is needing assistance. 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 Can we please see if there assistance for this gentleman."   I called the patient and left him a vm

## 2023-06-13 NOTE — Telephone Encounter (Signed)
 Noted.

## 2023-06-15 ENCOUNTER — Telehealth: Payer: Self-pay | Admitting: Cardiology

## 2023-06-15 ENCOUNTER — Ambulatory Visit: Attending: Cardiology

## 2023-06-15 DIAGNOSIS — I4891 Unspecified atrial fibrillation: Secondary | ICD-10-CM | POA: Diagnosis not present

## 2023-06-15 DIAGNOSIS — R011 Cardiac murmur, unspecified: Secondary | ICD-10-CM

## 2023-06-15 LAB — ECHOCARDIOGRAM COMPLETE
AR max vel: 1.23 cm2
AV Area VTI: 1.35 cm2
AV Area mean vel: 1.21 cm2
AV Mean grad: 4.3 mmHg
AV Peak grad: 9.6 mmHg
Ao pk vel: 1.55 m/s
MV M vel: 4.21 m/s
MV Peak grad: 70.9 mmHg
Radius: 0.6 cm
S' Lateral: 3.7 cm

## 2023-06-15 MED ORDER — APIXABAN 2.5 MG PO TABS: 2.5000 mg | ORAL_TABLET | Freq: Two times a day (BID) | ORAL | Status: DC

## 2023-06-15 NOTE — Telephone Encounter (Signed)
 Called patient and let him know that we have samples in the office available for him to pick up. He thanked me for calling and had no further questions.

## 2023-06-15 NOTE — Telephone Encounter (Signed)
 Patient would like eliquis samples if possible.  [phone]

## 2023-06-20 ENCOUNTER — Telehealth: Payer: Self-pay

## 2023-06-20 NOTE — Telephone Encounter (Signed)
 Left VM to return call

## 2023-06-20 NOTE — Telephone Encounter (Signed)
-----   Message from Gordon R Revankar sent at 06/15/2023  5:25 PM EDT ----- Ejection fraction is mild to moderately depressed.  Medical management.  I would like to see him for follow-up in 3 months.  Copy primary Garwin Brothers, MD 06/15/2023 5:25 PM

## 2023-07-13 DIAGNOSIS — R339 Retention of urine, unspecified: Secondary | ICD-10-CM | POA: Diagnosis not present

## 2023-07-20 ENCOUNTER — Telehealth: Payer: Self-pay | Admitting: Cardiology

## 2023-07-20 MED ORDER — APIXABAN 2.5 MG PO TABS: 2.5000 mg | ORAL_TABLET | Freq: Two times a day (BID) | ORAL | Status: DC

## 2023-07-20 NOTE — Telephone Encounter (Signed)
 [phone] - patient would like to be called if eliquis  samples are available

## 2023-07-20 NOTE — Telephone Encounter (Signed)
 Left vm that samples are ready for pickup

## 2023-08-15 ENCOUNTER — Telehealth: Payer: Self-pay

## 2023-08-15 MED ORDER — APIXABAN 2.5 MG PO TABS: 2.5000 mg | ORAL_TABLET | Freq: Two times a day (BID) | ORAL | Status: DC

## 2023-08-15 NOTE — Telephone Encounter (Signed)
 Eliquis  2.5mg  #56 1 tablet BID samples provided

## 2023-08-17 ENCOUNTER — Ambulatory Visit: Payer: Medicare Other | Admitting: Podiatry

## 2023-08-17 DIAGNOSIS — M79674 Pain in right toe(s): Secondary | ICD-10-CM

## 2023-08-17 DIAGNOSIS — M79675 Pain in left toe(s): Secondary | ICD-10-CM

## 2023-08-17 DIAGNOSIS — B351 Tinea unguium: Secondary | ICD-10-CM

## 2023-08-17 NOTE — Progress Notes (Signed)
       Subjective:  Patient ID: Robert Webb, male    DOB: [birth year],  MRN: [id number]   Robert Webb presents to clinic today for:  Chief Complaint  Patient presents with   [facility]    RFC without callous. Not diabetic and takes Elliquis.    Patient notes nails are thick, discolored, elongated and painful in shoegear when trying to ambulate.  He states tomorrow will be 2 years since he had a stroke.  His wife is with him today.  PCP is Adrian Hopper, MD.  Past Medical History:  Diagnosis Date   Acute ischemic left MCA stroke (HCC) 08/18/2021   Atrial fibrillation (HCC) 08/20/2021   Mixed dyslipidemia 09/22/2021    Past Surgical History:  Procedure Laterality Date   CHOLECYSTECTOMY     HERNIA REPAIR      Allergies  Allergen Reactions   Demerol [Meperidine Hcl] Nausea And Vomiting    Review of Systems: Negative except as noted in the HPI.  Objective:  BROXTON BROADY is a pleasant 89 y.o. male in NAD. AAO x 3.  Vascular Examination: Capillary refill time is 3-5 seconds to toes bilateral. Palpable pedal pulses b/l LE. Digital hair sparse b/l.  Skin temperature gradient WNL b/l.   Dermatological Examination: Pedal skin with decreased turgor, texture and tone b/l. No open wounds. No interdigital macerations b/l. Toenails x10 are 3mm thick, discolored, dystrophic with subungual debris. There is pain with compression of the nail plates.  They are elongated x10  Assessment/Plan: 1. Pain due to onychomycosis of toenails of both feet    The mycotic toenails were sharply debrided x10 with sterile nail nippers and a power debriding burr to decrease bulk/thickness and length.    Return in about 3 months (around 11/17/2023) for RFC.   Joe Murders, DPM, FACFAS [facility]     [address] [place], [place] [address]                Office [phone]  Fax [phone]

## 2023-09-06 ENCOUNTER — Other Ambulatory Visit: Payer: Self-pay | Admitting: Cardiology

## 2023-09-13 ENCOUNTER — Other Ambulatory Visit: Payer: Self-pay

## 2023-09-14 ENCOUNTER — Encounter: Payer: Self-pay | Admitting: Cardiology

## 2023-09-14 ENCOUNTER — Ambulatory Visit: Attending: Cardiology | Admitting: Cardiology

## 2023-09-14 VITALS — BP 130/80 | HR 82 | Ht 67.0 in | Wt 125.2 lb

## 2023-09-14 DIAGNOSIS — I429 Cardiomyopathy, unspecified: Secondary | ICD-10-CM | POA: Diagnosis not present

## 2023-09-14 DIAGNOSIS — I4891 Unspecified atrial fibrillation: Secondary | ICD-10-CM | POA: Diagnosis not present

## 2023-09-14 DIAGNOSIS — E782 Mixed hyperlipidemia: Secondary | ICD-10-CM | POA: Diagnosis not present

## 2023-09-14 DIAGNOSIS — Z8673 Personal history of transient ischemic attack (TIA), and cerebral infarction without residual deficits: Secondary | ICD-10-CM

## 2023-09-14 HISTORY — DX: Cardiomyopathy, unspecified: I42.9

## 2023-09-14 MED ORDER — APIXABAN 2.5 MG PO TABS: 2.5000 mg | ORAL_TABLET | Freq: Two times a day (BID) | ORAL | Status: DC

## 2023-09-14 NOTE — Progress Notes (Signed)
 Eliquis  dose check  Age 89 Weight 58.1 kg  Pt remains on 2.5 mg twice daily

## 2023-09-14 NOTE — Patient Instructions (Signed)

## 2023-09-14 NOTE — Progress Notes (Signed)
 Cardiology Office Note:    Date:  09/14/2023   ID:  BOSTEN NEWSTROM, DOB December 02, 1934, MRN [id number]  PCP:  Keren Vicenta BRAVO, MD  Cardiologist:  Jennifer JONELLE Crape, MD   Referring MD: Keren Vicenta BRAVO, MD    ASSESSMENT:    1. Atrial fibrillation, unspecified type (HCC)   2. Mixed dyslipidemia   3. History of stroke   4. Cardiomyopathy, idiopathic (HCC)    PLAN:    In order of problems listed above:  Primary prevention stressed with the patient.  Importance of compliance with diet medication stressed and patient verbalized standing. Cardiomyopathy: Newly diagnosed.  Patient is not a candidate for guideline directed therapy because of borderline blood pressure.  Also is not keen on any new medications because of cost issues.  He takes care of activities of living and happy about it.  I respect his wishes. Atrial fibrillation:I discussed with the patient atrial fibrillation, disease process. Management and therapy including rate and rhythm control, anticoagulation benefits and potential risks were discussed extensively with the patient. Patient had multiple questions which were answered to patient's satisfaction. Mixed dyslipidemia: On lipid-lowering medications followed by primary care.  Diet emphasized. Patient will be seen in follow-up appointment in 6 months or earlier if the patient has any concerns.    Medication Adjustments/Labs and Tests Ordered: Current medicines are reviewed at length with the patient today.  Concerns regarding medicines are outlined above.  No orders of the defined types were placed in this encounter.  No orders of the defined types were placed in this encounter.    No chief complaint on file.    History of Present Illness:    Robert Webb is a 89 y.o. male.  Patient has past medical history of recently diagnosed cardiomyopathy, atrial fibrillation, mixed dyslipidemia and history of stroke.  He denies any problems at this time and takes care of  activities of daily living.  No chest pain orthopnea or PND.  At the time of my evaluation, the patient is alert awake oriented and in no distress.  Past Medical History:  Diagnosis Date   Acute ischemic left MCA stroke (HCC) 08/18/2021   Atrial fibrillation (HCC) 08/20/2021   History of stroke 06/01/2023   Mixed dyslipidemia 09/22/2021    Past Surgical History:  Procedure Laterality Date   CHOLECYSTECTOMY     HERNIA REPAIR      Current Medications: Current Meds  Medication Sig   apixaban  (ELIQUIS ) 2.5 MG TABS tablet Take 1 tablet (2.5 mg total) by mouth 2 (two) times daily.   bethanechol (URECHOLINE) 25 MG tablet Take 25 mg by mouth 2 (two) times daily.   dutasteride (AVODART) 0.5 MG capsule Take 0.5 mg by mouth daily.   rosuvastatin  (CRESTOR ) 10 MG tablet Take 1 tablet by mouth once daily   tamsulosin (FLOMAX) 0.4 MG CAPS capsule Take 0.4 mg by mouth daily.     Allergies:   Demerol [meperidine hcl]   Social History   Socioeconomic History   Marital status: Married    Spouse name: Esaias Cleavenger   Number of children: 1   Years of education: Not on file   Highest education level: Not on file  Occupational History   Occupation: retired  Tobacco Use   Smoking status: Former    Types: Cigarettes    Passive exposure: Never   Smokeless tobacco: Never   Tobacco comments:    Pt states he smoked when he was in high school   Vaping Use  Vaping status: Never Used  Substance and Sexual Activity   Alcohol use: Never   Drug use: Never   Sexual activity: Not Currently  Other Topics Concern   Not on file  Social History Narrative   Not on file   Social Drivers of Health   Financial Resource Strain: Low Risk  (08/18/2021)   Overall Financial Resource Strain (CARDIA)    Difficulty of Paying Living Expenses: Not hard at all  Food Insecurity: No Food Insecurity (08/18/2021)   Hunger Vital Sign    Worried About Running Out of Food in the Last Year: Never true    Ran Out of  Food in the Last Year: Never true  Transportation Needs: No Transportation Needs (08/18/2021)   PRAPARE - Administrator, Civil Service (Medical): No    Lack of Transportation (Non-Medical): No  Physical Activity: Not on file  Stress: No Stress Concern Present (08/18/2021)   [employer] of Occupational Health - Occupational Stress Questionnaire    Feeling of Stress : Not at all  Social Connections: Socially Integrated (08/18/2021)   Social Connection and Isolation Panel    Frequency of Communication with Friends and Family: More than three times a week    Frequency of Social Gatherings with Friends and Family: Three times a week    Attends Religious Services: More than 4 times per year    Active Member of Clubs or Organizations: Yes    Attends Engineer, structural: More than 4 times per year    Marital Status: Married     Family History: The patient's family history includes Cancer in his mother. There is no history of Hypertension, Diabetes, or Heart disease.  ROS:   Please see the history of present illness.    All other systems reviewed and are negative.  EKGs/Labs/Other Studies Reviewed:    The following studies were reviewed today: I discussed findings of the echo with the patient at length   Recent Labs: No results found for requested labs within last 365 days.  Recent Lipid Panel    Component Value Date/Time   CHOL 132 08/18/2021 0421   TRIG 20 08/18/2021 0421   HDL 50 08/18/2021 0421   CHOLHDL 2.6 08/18/2021 0421   VLDL 4 08/18/2021 0421   LDLCALC 78 08/18/2021 0421    Physical Exam:    VS:  There were no vitals taken for this visit.    Wt Readings from Last 3 Encounters:  06/01/23 128 lb (58.1 kg)  09/02/22 129 lb 6.4 oz (58.7 kg)  10/11/21 124 lb 8 oz (56.5 kg)     GEN: Patient is in no acute distress HEENT: Normal NECK: No JVD; No carotid bruits LYMPHATICS: No lymphadenopathy CARDIAC: Hear sounds regular, 2/6 systolic murmur  at the apex. RESPIRATORY:  Clear to auscultation without rales, wheezing or rhonchi  ABDOMEN: Soft, non-tender, non-distended MUSCULOSKELETAL:  No edema; No deformity  SKIN: Warm and dry NEUROLOGIC:  Alert and oriented x 3 PSYCHIATRIC:  Normal affect   Signed, Jennifer JONELLE Crape, MD  09/14/2023 1:18 PM    [facility]

## 2023-10-18 DIAGNOSIS — I2109 ST elevation (STEMI) myocardial infarction involving other coronary artery of anterior wall: Secondary | ICD-10-CM | POA: Diagnosis not present

## 2023-10-18 DIAGNOSIS — I444 Left anterior fascicular block: Secondary | ICD-10-CM | POA: Diagnosis not present

## 2023-10-18 DIAGNOSIS — R918 Other nonspecific abnormal finding of lung field: Secondary | ICD-10-CM | POA: Diagnosis not present

## 2023-10-18 DIAGNOSIS — A419 Sepsis, unspecified organism: Secondary | ICD-10-CM | POA: Diagnosis not present

## 2023-10-18 DIAGNOSIS — I509 Heart failure, unspecified: Secondary | ICD-10-CM | POA: Diagnosis not present

## 2023-10-18 DIAGNOSIS — R110 Nausea: Secondary | ICD-10-CM | POA: Diagnosis not present

## 2023-10-18 DIAGNOSIS — R112 Nausea with vomiting, unspecified: Secondary | ICD-10-CM | POA: Diagnosis not present

## 2023-10-18 DIAGNOSIS — R531 Weakness: Secondary | ICD-10-CM | POA: Diagnosis not present

## 2023-10-18 DIAGNOSIS — R509 Fever, unspecified: Secondary | ICD-10-CM | POA: Diagnosis not present

## 2023-10-18 DIAGNOSIS — J811 Chronic pulmonary edema: Secondary | ICD-10-CM | POA: Diagnosis not present

## 2023-10-18 DIAGNOSIS — R9431 Abnormal electrocardiogram [ECG] [EKG]: Secondary | ICD-10-CM | POA: Diagnosis not present

## 2023-10-18 DIAGNOSIS — I4891 Unspecified atrial fibrillation: Secondary | ICD-10-CM | POA: Diagnosis not present

## 2023-10-19 DIAGNOSIS — I272 Pulmonary hypertension, unspecified: Secondary | ICD-10-CM | POA: Diagnosis not present

## 2023-10-19 DIAGNOSIS — R7303 Prediabetes: Secondary | ICD-10-CM | POA: Diagnosis not present

## 2023-10-19 DIAGNOSIS — I110 Hypertensive heart disease with heart failure: Secondary | ICD-10-CM | POA: Diagnosis not present

## 2023-10-19 DIAGNOSIS — Z79899 Other long term (current) drug therapy: Secondary | ICD-10-CM | POA: Diagnosis not present

## 2023-10-19 DIAGNOSIS — R7401 Elevation of levels of liver transaminase levels: Secondary | ICD-10-CM | POA: Diagnosis not present

## 2023-10-19 DIAGNOSIS — E785 Hyperlipidemia, unspecified: Secondary | ICD-10-CM | POA: Diagnosis not present

## 2023-10-19 DIAGNOSIS — I2109 ST elevation (STEMI) myocardial infarction involving other coronary artery of anterior wall: Secondary | ICD-10-CM | POA: Diagnosis not present

## 2023-10-19 DIAGNOSIS — Z87891 Personal history of nicotine dependence: Secondary | ICD-10-CM | POA: Diagnosis not present

## 2023-10-19 DIAGNOSIS — I509 Heart failure, unspecified: Secondary | ICD-10-CM | POA: Diagnosis not present

## 2023-10-19 DIAGNOSIS — R6520 Severe sepsis without septic shock: Secondary | ICD-10-CM | POA: Diagnosis not present

## 2023-10-19 DIAGNOSIS — I69322 Dysarthria following cerebral infarction: Secondary | ICD-10-CM | POA: Diagnosis not present

## 2023-10-19 DIAGNOSIS — J811 Chronic pulmonary edema: Secondary | ICD-10-CM | POA: Diagnosis not present

## 2023-10-19 DIAGNOSIS — A419 Sepsis, unspecified organism: Secondary | ICD-10-CM | POA: Diagnosis not present

## 2023-10-19 DIAGNOSIS — I444 Left anterior fascicular block: Secondary | ICD-10-CM | POA: Diagnosis not present

## 2023-10-19 DIAGNOSIS — Z888 Allergy status to other drugs, medicaments and biological substances status: Secondary | ICD-10-CM | POA: Diagnosis not present

## 2023-10-19 DIAGNOSIS — I371 Nonrheumatic pulmonary valve insufficiency: Secondary | ICD-10-CM | POA: Diagnosis not present

## 2023-10-19 DIAGNOSIS — A4152 Sepsis due to Pseudomonas: Secondary | ICD-10-CM | POA: Diagnosis not present

## 2023-10-19 DIAGNOSIS — R509 Fever, unspecified: Secondary | ICD-10-CM | POA: Diagnosis not present

## 2023-10-19 DIAGNOSIS — I361 Nonrheumatic tricuspid (valve) insufficiency: Secondary | ICD-10-CM | POA: Diagnosis not present

## 2023-10-19 DIAGNOSIS — I4891 Unspecified atrial fibrillation: Secondary | ICD-10-CM | POA: Diagnosis not present

## 2023-10-19 DIAGNOSIS — I639 Cerebral infarction, unspecified: Secondary | ICD-10-CM | POA: Diagnosis not present

## 2023-10-19 DIAGNOSIS — R000 Tachycardia, unspecified: Secondary | ICD-10-CM | POA: Diagnosis not present

## 2023-10-19 DIAGNOSIS — I340 Nonrheumatic mitral (valve) insufficiency: Secondary | ICD-10-CM | POA: Diagnosis not present

## 2023-10-19 DIAGNOSIS — I4819 Other persistent atrial fibrillation: Secondary | ICD-10-CM | POA: Diagnosis not present

## 2023-10-19 DIAGNOSIS — I517 Cardiomegaly: Secondary | ICD-10-CM | POA: Diagnosis not present

## 2023-10-19 DIAGNOSIS — I4729 Other ventricular tachycardia: Secondary | ICD-10-CM | POA: Diagnosis not present

## 2023-10-19 DIAGNOSIS — E872 Acidosis, unspecified: Secondary | ICD-10-CM | POA: Diagnosis not present

## 2023-10-19 DIAGNOSIS — R918 Other nonspecific abnormal finding of lung field: Secondary | ICD-10-CM | POA: Diagnosis not present

## 2023-10-19 DIAGNOSIS — R112 Nausea with vomiting, unspecified: Secondary | ICD-10-CM | POA: Diagnosis not present

## 2023-10-19 DIAGNOSIS — R9431 Abnormal electrocardiogram [ECG] [EKG]: Secondary | ICD-10-CM | POA: Diagnosis not present

## 2023-10-19 DIAGNOSIS — Z7901 Long term (current) use of anticoagulants: Secondary | ICD-10-CM | POA: Diagnosis not present

## 2023-10-20 ENCOUNTER — Telehealth: Payer: Self-pay

## 2023-10-20 MED ORDER — APIXABAN 2.5 MG PO TABS: 2.5000 mg | ORAL_TABLET | Freq: Two times a day (BID) | ORAL | Status: DC

## 2023-10-20 NOTE — Telephone Encounter (Signed)
Samples given.  

## 2023-10-23 DIAGNOSIS — I4891 Unspecified atrial fibrillation: Secondary | ICD-10-CM | POA: Diagnosis not present

## 2023-10-23 DIAGNOSIS — I444 Left anterior fascicular block: Secondary | ICD-10-CM | POA: Diagnosis not present

## 2023-10-24 DIAGNOSIS — I517 Cardiomegaly: Secondary | ICD-10-CM | POA: Diagnosis not present

## 2023-10-24 DIAGNOSIS — I340 Nonrheumatic mitral (valve) insufficiency: Secondary | ICD-10-CM | POA: Diagnosis not present

## 2023-10-24 DIAGNOSIS — I371 Nonrheumatic pulmonary valve insufficiency: Secondary | ICD-10-CM | POA: Diagnosis not present

## 2023-10-24 DIAGNOSIS — I361 Nonrheumatic tricuspid (valve) insufficiency: Secondary | ICD-10-CM | POA: Diagnosis not present

## 2023-10-24 DIAGNOSIS — I272 Pulmonary hypertension, unspecified: Secondary | ICD-10-CM

## 2023-10-25 DIAGNOSIS — I639 Cerebral infarction, unspecified: Secondary | ICD-10-CM | POA: Diagnosis not present

## 2023-10-25 DIAGNOSIS — E785 Hyperlipidemia, unspecified: Secondary | ICD-10-CM | POA: Diagnosis not present

## 2023-10-25 DIAGNOSIS — I4819 Other persistent atrial fibrillation: Secondary | ICD-10-CM | POA: Diagnosis not present

## 2023-10-25 DIAGNOSIS — R000 Tachycardia, unspecified: Secondary | ICD-10-CM | POA: Diagnosis not present

## 2023-11-02 DIAGNOSIS — I5021 Acute systolic (congestive) heart failure: Secondary | ICD-10-CM | POA: Diagnosis not present

## 2023-11-02 DIAGNOSIS — R6520 Severe sepsis without septic shock: Secondary | ICD-10-CM | POA: Diagnosis not present

## 2023-11-02 DIAGNOSIS — B965 Pseudomonas (aeruginosa) (mallei) (pseudomallei) as the cause of diseases classified elsewhere: Secondary | ICD-10-CM | POA: Diagnosis not present

## 2023-11-02 DIAGNOSIS — R7881 Bacteremia: Secondary | ICD-10-CM | POA: Diagnosis not present

## 2023-11-02 DIAGNOSIS — I4891 Unspecified atrial fibrillation: Secondary | ICD-10-CM | POA: Diagnosis not present

## 2023-11-02 DIAGNOSIS — E872 Acidosis, unspecified: Secondary | ICD-10-CM | POA: Diagnosis not present

## 2023-11-02 DIAGNOSIS — A419 Sepsis, unspecified organism: Secondary | ICD-10-CM | POA: Diagnosis not present

## 2023-11-09 ENCOUNTER — Other Ambulatory Visit: Payer: Self-pay

## 2023-11-09 ENCOUNTER — Ambulatory Visit

## 2023-11-09 DIAGNOSIS — E872 Acidosis, unspecified: Secondary | ICD-10-CM | POA: Insufficient documentation

## 2023-11-09 DIAGNOSIS — J151 Pneumonia due to Pseudomonas: Secondary | ICD-10-CM | POA: Insufficient documentation

## 2023-11-09 DIAGNOSIS — I5021 Acute systolic (congestive) heart failure: Secondary | ICD-10-CM | POA: Insufficient documentation

## 2023-11-09 DIAGNOSIS — R7881 Bacteremia: Secondary | ICD-10-CM | POA: Insufficient documentation

## 2023-11-16 ENCOUNTER — Ambulatory Visit: Admitting: Podiatry

## 2023-11-16 DIAGNOSIS — M79675 Pain in left toe(s): Secondary | ICD-10-CM | POA: Diagnosis not present

## 2023-11-16 DIAGNOSIS — M79674 Pain in right toe(s): Secondary | ICD-10-CM | POA: Diagnosis not present

## 2023-11-16 DIAGNOSIS — B351 Tinea unguium: Secondary | ICD-10-CM

## 2023-11-16 NOTE — Progress Notes (Signed)
       Subjective:  Patient ID: Robert Webb, male    DOB: 07-16-1934,  MRN: [id number]   Robert Webb presents to clinic today for:  Chief Complaint  Patient presents with   [facility]    RFC with out callous Not Diabetic Eliquis    Patient notes nails are thick, discolored, elongated and painful in shoegear when trying to ambulate.  His wife is with him today.  PCP is Keren Vicenta BRAVO, MD.  Past Medical History:  Diagnosis Date   Acute ischemic left MCA stroke (HCC) 08/18/2021   Acute systolic CHF (congestive heart failure) (HCC)    Atrial fibrillation (HCC) 08/20/2021   Bacteremia due to Pseudomonas    Cardiomyopathy, idiopathic (HCC) 09/14/2023   History of stroke 06/01/2023   Mixed dyslipidemia 09/22/2021   Pseudomonas pneumonia (HCC)    Severe sepsis with lactic acidosis (HCC)     Past Surgical History:  Procedure Laterality Date   CHOLECYSTECTOMY     HERNIA REPAIR      Allergies  Allergen Reactions   Demerol [Meperidine Hcl] Nausea And Vomiting    Review of Systems: Negative except as noted in the HPI.  Objective:  Robert Webb is a pleasant 89 y.o. male in NAD. AAO x 3.  Vascular Examination: Capillary refill time is 3-5 seconds to toes bilateral. Palpable pedal pulses b/l LE. Digital hair sparse b/l.  Skin temperature gradient WNL b/l.   Dermatological Examination: Pedal skin with decreased turgor, texture and tone b/l. No open wounds. No interdigital macerations b/l. Toenails x10 are 3mm thick, discolored, dystrophic with subungual debris. There is pain with compression of the nail plates.  They are elongated x10  Assessment/Plan: 1. Pain due to onychomycosis of toenails of both feet    The mycotic toenails were sharply debrided x10 with sterile nail nippers and a power debriding burr to decrease bulk/thickness and length.    Return in about 3 months (around 02/16/2024) for RFC.   Awanda CHARM Imperial, DPM, FACFAS [facility]      [address] [place], [place] [address]                Office [phone]  Fax [phone]

## 2023-11-21 ENCOUNTER — Other Ambulatory Visit: Payer: Self-pay | Admitting: Cardiology

## 2023-11-21 NOTE — Telephone Encounter (Signed)
 Prescription refill request for Eliquis  received. Indication:afib Last office visit:6/25 Scr:0.97  2025 Age: 89 Weight:56.8  kg  Prescription refilled

## 2023-11-30 ENCOUNTER — Ambulatory Visit

## 2023-11-30 ENCOUNTER — Ambulatory Visit: Admitting: Cardiology

## 2023-11-30 VITALS — BP 122/80 | HR 64 | Ht 67.0 in | Wt 125.2 lb

## 2023-11-30 DIAGNOSIS — I429 Cardiomyopathy, unspecified: Secondary | ICD-10-CM

## 2023-11-30 DIAGNOSIS — I4891 Unspecified atrial fibrillation: Secondary | ICD-10-CM

## 2023-11-30 DIAGNOSIS — I4821 Permanent atrial fibrillation: Secondary | ICD-10-CM | POA: Diagnosis not present

## 2023-11-30 MED ORDER — SPIRONOLACTONE 25 MG PO TABS: 12.5000 mg | ORAL_TABLET | Freq: Every day | ORAL | 3 refills | Status: AC

## 2023-11-30 MED ORDER — LISINOPRIL 5 MG PO TABS: 5.0000 mg | ORAL_TABLET | Freq: Every day | ORAL | 3 refills | Status: AC

## 2023-11-30 MED ORDER — APIXABAN 2.5 MG PO TABS: 2.5000 mg | ORAL_TABLET | Freq: Two times a day (BID) | ORAL | 0 refills | Status: DC

## 2023-11-30 MED ORDER — EMPAGLIFLOZIN 10 MG PO TABS: 10.0000 mg | ORAL_TABLET | Freq: Every day | ORAL | 3 refills | Status: AC

## 2023-11-30 NOTE — Assessment & Plan Note (Signed)
 This was initially diagnosed May 2023 during his admission for acute CVA. LVEF at the time 45 to 50%. Recent repeat echocardiograms from March 2025 noted LVEF decreased to 35 to 40%. Repeat echocardiogram during inpatient stay 10/24/2023 [facility] LVEF once again 35 to 40%.  No prior ischemic workup for cardiomyopathy. Had extensive discussion with both patient and his wife with regards to the diagnosis of cardiomyopathy. Various causes of cardiomyopathy discussed with the most common ischemic due to CAD versus nonischemic causes  Typical workup for this reviewed including evaluation with stress test and if abnormal consideration for coronary angiogram cardiac cath and revascularization if indicated using stents. They were hesitant to consider this. They mention their preference is to continue with medications alone and would like to avoid any noninvasive testing or invasive procedures for evaluation of etiology of cardiomyopathy.  Discussed long-term effects of cardiomyopathy and potential for heart failure symptoms and sudden cardiac death also reviewed.  Currently he does not have any signs or symptoms of heart failure. He does not show any signs of volume overload. Recommended to continue with salt restriction and weight monitoring. Currently no indication for loop diuretic.  Rule out goal-directed medical therapy discussed at length. Specifically the role of these medications for preserving cardiac function, improving cardiac function, reducing overall morbidity and mortality risk and risk of hospitalization were discussed in simple terms.  They showed concern about escalating any medications and adding more medications to his list. However on further review he appears to be holding up good blood pressures, denies any symptoms of syncope near syncope or lightheadedness or extreme fatigue or tiredness that would raise the suspicion for significant low blood pressures.  He  has been tolerating current medications carvedilol and lisinopril  since discharge from the hospital. In addition to those further discussed the role of SGLT2 inhibitors such as Jardiance  and Doreen piper his wife identified immediately as diabetes medicine and was wondering and questioning the role of it in heart failure management].  Informed him about the beneficial role of these medications and heart failure.  Potential side effects including low blood pressure, orthostatic blood pressure changes, urinary tract infection and perineal infections reviewed in detail. Also reviewed spironolactone , its potential benefits and risks.  After extensive discussion the acknowledged and are agreeable to proceed with the 4 classes of medications to help optimize cardiomyopathy management.  - Continue carvedilol 3.125 mg twice daily. - Continue lisinopril  lowered the dose to 5 mg once daily given the concerns about low blood pressures - Start spironolactone  12.5 mg once daily - Start Jardiance  or Farxiga 10 mg once daily depending on the cost coverage through their insurance.  - Obtain blood work BMP and magnesium tentatively in 7 days after starting these medications.   Tentative follow-up in the office in 3 months.

## 2023-11-30 NOTE — Patient Instructions (Signed)
 Medication Instructions:  Your physician has recommended you make the following change in your medication:   START: Lisinopril  5 mg daily START: Aldactone  12.5 mg daily START: Jardiance  10 mg daily   *If you need a refill on your cardiac medications before your next appointment, please call your pharmacy*  Lab Work: Your physician recommends that you return for lab work in:   Labs in 1 week: BMP, Magnesium  If you have labs (blood work) drawn today and your tests are completely normal, you will receive your results only by: MyChart Message (if you have MyChart) OR A paper copy in the mail If you have any lab test that is abnormal or we need to change your treatment, we will call you to review the results.  Testing/Procedures: None  Follow-Up: At [facility], you and your health needs are our priority.  As part of our continuing mission to provide you with exceptional heart care, our providers are all part of one team.  This team includes your primary Cardiologist (physician) and Advanced Practice Providers or APPs (Physician Assistants and Nurse Practitioners) who all work together to provide you with the care you need, when you need it.  Your next appointment:   3 month(s)  Provider:   Alean Kobus, MD    We recommend signing up for the patient portal called MyChart.  Sign up information is provided on this After Visit Summary.  MyChart is used to connect with patients for Virtual Visits (Telemedicine).  Patients are able to view lab/test results, encounter notes, upcoming appointments, etc.  Non-urgent messages can be sent to your provider as well.   To learn more about what you can do with MyChart, go to [email or web address].   Other Instructions None

## 2023-11-30 NOTE — Progress Notes (Signed)
 Cardiology Consultation:    Date:  11/30/2023   ID:  Robert Webb, DOB [birth year], MRN [id number]  PCP:  Robert Vicenta BRAVO, MD  Cardiologist:  Robert SAUNDERS Arthor Gorter, MD   Referring MD: Robert Vicenta BRAVO, MD   No chief complaint on file.    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    Problem List Items Addressed This Visit     Permanent atrial fibrillation [facility]) - Primary   Initially diagnosed May 2023 during his diagnosis of acute CVA. Has remained under good rate control. Never pursued rhythm control. Remains asymptomatic and they wish to continue with current management and do not want to consider any procedures.  CHA2DS2-VASc score 4 [age, CVA] Remains on Eliquis  2.5 mg twice daily [low-dose considering age 22 and weight 56.8 kg]. Tolerating well no side effects.      Relevant Medications   lisinopril  (ZESTRIL ) 5 MG tablet   spironolactone  (ALDACTONE ) 25 MG tablet   apixaban  (ELIQUIS ) 2.5 MG TABS tablet   Cardiomyopathy (HCC)   This was initially diagnosed May 2023 during his admission for acute CVA. LVEF at the time 45 to 50%. Recent repeat echocardiograms from March 2025 noted LVEF decreased to 35 to 40%. Repeat  echocardiogram during inpatient stay 10/24/2023 [facility] LVEF once again 35 to 40%.  No prior ischemic workup for cardiomyopathy. Had extensive discussion with both patient and his wife with regards to the diagnosis of cardiomyopathy. Various causes of cardiomyopathy discussed with the most common ischemic due to CAD versus nonischemic causes  Typical workup for this reviewed including evaluation with stress test and if abnormal consideration for coronary angiogram cardiac cath and revascularization if indicated using stents. They were hesitant to consider this. They mention their preference is to continue with medications alone and would like to avoid any noninvasive testing or invasive procedures for evaluation of etiology of cardiomyopathy.  Discussed long-term effects of cardiomyopathy and potential for heart failure symptoms and sudden cardiac death also reviewed.  Currently he does not have any signs or symptoms of heart failure. He does not show any signs of volume overload. Recommended to continue with salt restriction and weight monitoring. Currently no indication for loop diuretic.  Rule out goal-directed medical therapy discussed at length. Specifically the role of these medications for preserving cardiac function, improving cardiac function, reducing overall morbidity and mortality risk and risk of hospitalization were discussed in simple terms.  They showed concern about escalating any medications and adding more medications to his list. However on further review he appears to be holding up good blood pressures, denies any symptoms of syncope near syncope or lightheadedness or extreme fatigue or tiredness that would raise the suspicion for significant low blood pressures.  He has been tolerating current medications carvedilol and lisinopril  since discharge from the hospital.  In addition to those further discussed the role of SGLT2 inhibitors such as Jardiance  and  Farxiga [which his wife identified immediately as diabetes medicine and was wondering and questioning the role of it in heart failure management].  Informed him about the beneficial role of these medications and heart failure.  Potential side effects including low blood pressure, orthostatic blood pressure changes, urinary tract infection and perineal infections reviewed in detail. Also reviewed spironolactone , its potential benefits and risks.  After extensive discussion the acknowledged and are agreeable to proceed with the 4 classes of medications to help optimize cardiomyopathy management.  - Continue carvedilol 3.125 mg twice daily. - Continue lisinopril  lowered the dose to 5 mg once daily given the concerns about low blood pressures - Start spironolactone  12.5 mg once daily - Start Jardiance  or Farxiga 10 mg once daily depending on the cost coverage through their insurance.  - Obtain blood work BMP and magnesium tentatively in 7 days after starting these medications.   Tentative follow-up in the office in 3 months.         Relevant Medications   lisinopril  (ZESTRIL ) 5 MG tablet   spironolactone  (ALDACTONE ) 25 MG tablet   apixaban  (ELIQUIS ) 2.5 MG TABS tablet   Overall they are content with how the clinical status is and understandably resistant to consider escalation of medical therapy. Hence spent extensive time discussing his diagnosis and options for further evaluation and optimization of care for preserving overall status and lowering future cardiovascular risk.  Given his advanced age, potential for further deterioration of cardiac function reviewed and discussed limited options of advanced heart failure in that setting. The understanding of this. Return to clinic for follow-up in 3 months.     History of Present Illness:    Robert Webb is a 89 y.o. male who is being seen today for f/u visit. PCP is Robert Vicenta BRAVO, MD.  Last office visit at our office was  09/15/2023 with Robert Webb that is his primary cardiologist.  Pleasant man here for the visit accompanied by his wife and she helps manage all his medications.  They live together.  They have a daughter who lives nearby and is involved in their care but she currently was occupied with her family issues.  Offered them to get her on phone call today for discussion and to answer any questions, however at this time they have wished to hold off.  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  Recently was at [facility] admitted 10/19/2023 and was treated inpatient for 7 days for symptoms of nonproductive cough associated with nausea and vomiting and was being treated for respiratory infection and sepsis with antibiotics.  Cardiology was consulted inpatient in the setting of wide-complex tachycardia, reviewed by Dr. Vernona the consult and reviewed it as atrial fibrillation with aberrancy related wide QRS.  Continued on rate  control strategy.  Pleasant gentleman, mentions he has had no significant health issues until his stroke.  Despite the stroke he has had good recovery without any residual neurological deficits and kept himself physically active at home with day-to-day activities. Somewhat  limited due to osteoarthritis. Denies any chest pain, shortness of breath, orthopnea, paroxysmal nocturnal dyspnea. No pedal edema. Denies any lightheadedness, dizziness, syncopal episodes. Denies any blood in urine or stools.  His wife manages his medications and he takes them consistently. Recently at [facility] he was started on carvedilol and lisinopril  at discharge for guideline directed medical therapy optimization which they have been taking and has been tolerating well.  Mentions she does check blood pressures at home and numbers have been typically systolic 100 send above.  EKG in the clinic today shows atrial fibrillation ventricular rate 74/min, QRS duration 102 ms left anterior vascular block morphology. Prior EKG from 06/01/2023 noted atrial fibrillation with QRS duration 106 ms right superior axis normal isolated PVC versus aberrant conduction of QRS noted.  Blood work from recent [facility] admission inpatient stay and discharge summary note 10/25/2023 hemoglobin 13.2, hematocrit 37.8, WBC 5.9 and platelets 164 Sodium 140, potassium 3.9, BUN 17, creatinine 0.8 and eGFR   Past Medical History:  Diagnosis Date   Acute ischemic left MCA stroke (HCC) 08/18/2021   Acute systolic CHF (congestive heart failure) (HCC)    Atrial fibrillation (HCC) 08/20/2021   Bacteremia due to Pseudomonas    Cardiomyopathy, idiopathic (HCC) 09/14/2023   History of stroke 06/01/2023   Mixed dyslipidemia 09/22/2021   Pseudomonas pneumonia (HCC)    Severe sepsis with lactic acidosis (HCC)     Past Surgical History:  Procedure Laterality Date   CHOLECYSTECTOMY     HERNIA REPAIR      Current Medications: Current Meds  Medication Sig   apixaban  (ELIQUIS ) 2.5 MG TABS tablet Take 1 tablet by mouth twice daily   apixaban  (ELIQUIS ) 2.5 MG TABS tablet Take 1 tablet (2.5 mg total) by mouth 2 (two) times daily.   bethanechol (URECHOLINE) 25 MG tablet Take 25 mg by mouth 2  (two) times daily.   carvedilol (COREG) 3.125 MG tablet Take 3.125 mg by mouth 2 (two) times daily.   dutasteride (AVODART) 0.5 MG capsule Take 0.5 mg by mouth daily.   empagliflozin  (JARDIANCE ) 10 MG TABS tablet Take 1 tablet (10 mg total) by mouth daily before breakfast.   fexofenadine (ALLEGRA) 180 MG tablet Take 180 mg by mouth daily.   lisinopril  (ZESTRIL ) 5 MG tablet Take 1 tablet (5 mg total) by mouth daily.   rosuvastatin  (CRESTOR ) 10 MG tablet Take 1 tablet by mouth once daily   spironolactone  (ALDACTONE ) 25 MG tablet Take 0.5 tablets (12.5 mg total) by mouth daily.   tamsulosin (FLOMAX) 0.4 MG CAPS capsule Take 0.4 mg by mouth daily.   [DISCONTINUED] lisinopril  (ZESTRIL ) 10 MG tablet Take 10 mg by mouth daily.     Allergies:   Demerol [meperidine hcl]   Social History   Socioeconomic History   Marital status: Married    Spouse name: Landers Prajapati   Number of children: 1   Years of education: Not on file   Highest education level: Not on file  Occupational History   Occupation: retired  Tobacco Use   Smoking status: Former    Types: Cigarettes    Passive exposure: Never   Smokeless tobacco: Never   Tobacco comments:    Pt states he smoked when he was in  high school   Vaping Use   Vaping status: Never Used  Substance and Sexual Activity   Alcohol use: Never   Drug use: Never   Sexual activity: Not Currently  Other Topics Concern   Not on file  Social History Narrative   Not on file   Social Drivers of Health   Financial Resource Strain: Low Risk  (08/18/2021)   Overall Financial Resource Strain (CARDIA)    Difficulty of Paying Living Expenses: Not hard at all  Food Insecurity: No Food Insecurity (08/18/2021)   Hunger Vital Sign    Worried About Running Out of Food in the Last Year: Never true    Ran Out of Food in the Last Year: Never true  Transportation Needs: No Transportation Needs (08/18/2021)   PRAPARE - Administrator, Civil Service  (Medical): No    Lack of Transportation (Non-Medical): No  Physical Activity: Not on file  Stress: No Stress Concern Present (08/18/2021)   [employer] of Occupational Health - Occupational Stress Questionnaire    Feeling of Stress : Not at all  Social Connections: Socially Integrated (08/18/2021)   Social Connection and Isolation Panel    Frequency of Communication with Friends and Family: More than three times a week    Frequency of Social Gatherings with Friends and Family: Three times a week    Attends Religious Services: More than 4 times per year    Active Member of Clubs or Organizations: Yes    Attends Engineer, structural: More than 4 times per year    Marital Status: Married     Family History: The patient's family history includes Cancer in his mother. There is no history of Hypertension, Diabetes, or Heart disease. ROS:   Please see the history of present illness.    All 14 point review of systems negative except as described per history of present illness.  EKGs/Labs/Other Studies Reviewed:    The following studies were reviewed today:   EKG:  EKG Interpretation Date/Time:  Thursday November 30 2023 09:14:52 EDT Ventricular Rate:  74 PR Interval:    QRS Duration:  102 QT Interval:  422 QTC Calculation: 468 R Axis:   -84  Text Interpretation: Atrial fibrillation Left anterior fascicular block Septal infarct (cited on or before 18-Aug-2021) When compared with ECG of 01-Jun-2023 13:09, Left anterior fascicular block is now Present T wave amplitude has increased in Inferior leads Confirmed by Liborio Hai reddy [phone]) on 11/30/2023 9:31:53 AM    Recent Labs: No results found for requested labs within last 365 days.  Recent Lipid Panel    Component Value Date/Time   CHOL 132 08/18/2021 0421   TRIG 20 08/18/2021 0421   HDL 50 08/18/2021 0421   CHOLHDL 2.6 08/18/2021 0421   VLDL 4 08/18/2021 0421   LDLCALC 78 08/18/2021 0421    Physical  Exam:    VS:  BP 122/80   Pulse 64   Ht 5' 7 (1.702 m)   Wt 125 lb 3.2 oz (56.8 kg)   SpO2 97%   BMI 19.61 kg/m     Wt Readings from Last 3 Encounters:  11/30/23 125 lb 3.2 oz (56.8 kg)  09/14/23 125 lb 3.2 oz (56.8 kg)  06/01/23 128 lb (58.1 kg)     GENERAL:  Well nourished, well developed in no acute distress NECK: No JVD; No carotid bruits CARDIAC: Irregularly irregular, S1 and S2 present, no murmurs, no rubs, no gallops CHEST:  Clear to auscultation  without rales, wheezing or rhonchi  Extremities: No pitting pedal edema. Pulses bilaterally symmetric with radial 2+ and dorsalis pedis 2+ NEUROLOGIC:  Alert and oriented x 3  Medication Adjustments/Labs and Tests Ordered: Current medicines are reviewed at length with the patient today.  Concerns regarding medicines are outlined above.  Orders Placed This Encounter  Procedures   Basic Metabolic Panel (BMET)   Magnesium   EKG 12-Lead   Meds ordered this encounter  Medications   lisinopril  (ZESTRIL ) 5 MG tablet    Sig: Take 1 tablet (5 mg total) by mouth daily.    Dispense:  90 tablet    Refill:  3   spironolactone  (ALDACTONE ) 25 MG tablet    Sig: Take 0.5 tablets (12.5 mg total) by mouth daily.    Dispense:  90 tablet    Refill:  3   empagliflozin  (JARDIANCE ) 10 MG TABS tablet    Sig: Take 1 tablet (10 mg total) by mouth daily before breakfast.    Dispense:  90 tablet    Refill:  3   apixaban  (ELIQUIS ) 2.5 MG TABS tablet    Sig: Take 1 tablet (2.5 mg total) by mouth 2 (two) times daily.    Dispense:  28 tablet    Refill:  0    Signed, Sheridyn Canino reddy Jayleigh Notarianni, MD, MPH, [facility] 11/30/2023 9:51 AM    [facility]

## 2023-11-30 NOTE — Assessment & Plan Note (Signed)
 Initially diagnosed May 2023 during his diagnosis of acute CVA. Has remained under good rate control. Never pursued rhythm control. Remains asymptomatic and they wish to continue with current management and do not want to consider any procedures.  CHA2DS2-VASc score 4 [age, CVA] Remains on Eliquis  2.5 mg twice daily [low-dose considering age 89 and weight 56.8 kg]. Tolerating well no side effects.

## 2023-12-05 ENCOUNTER — Telehealth: Payer: Self-pay | Admitting: Cardiology

## 2023-12-05 NOTE — Telephone Encounter (Signed)
 Pt spouse came in concerned about how much the new medication is causing her husband to have frequent bathroom breaks. She asked for a nurse to give her a call

## 2023-12-05 NOTE — Telephone Encounter (Signed)
 Spoke with Ival per DPR who wanted to make sure that the medications that he had been started on was okay with his medication from the urologist. Advised that medications are ok to take with current medication.

## 2023-12-08 LAB — BASIC METABOLIC PANEL WITH GFR
BUN/Creatinine Ratio: 26 — ABNORMAL HIGH (ref 10–24)
BUN: 24 mg/dL (ref 8–27)
CO2: 21 mmol/L (ref 20–29)
Calcium: 9.5 mg/dL (ref 8.6–10.2)
Chloride: 107 mmol/L — ABNORMAL HIGH (ref 96–106)
Creatinine, Ser: 0.92 mg/dL (ref 0.76–1.27)
Glucose: 74 mg/dL (ref 70–99)
Potassium: 4.5 mmol/L (ref 3.5–5.2)
Sodium: 142 mmol/L (ref 134–144)
eGFR: 81 mL/min/1.73 (ref >59)

## 2023-12-08 LAB — MAGNESIUM: Magnesium: 2.2 mg/dL (ref 1.6–2.3)

## 2023-12-15 ENCOUNTER — Telehealth: Payer: Self-pay

## 2023-12-15 ENCOUNTER — Ambulatory Visit: Payer: Self-pay

## 2023-12-15 ENCOUNTER — Other Ambulatory Visit: Payer: Self-pay

## 2023-12-15 MED ORDER — EMPAGLIFLOZIN 10 MG PO TABS: 10.0000 mg | ORAL_TABLET | Freq: Every day | ORAL | 0 refills | Status: DC

## 2023-12-15 MED ORDER — APIXABAN 2.5 MG PO TABS: 2.5000 mg | ORAL_TABLET | Freq: Two times a day (BID) | ORAL | 0 refills | Status: DC

## 2023-12-15 NOTE — Telephone Encounter (Signed)
 Patient informed of results.

## 2023-12-15 NOTE — Telephone Encounter (Signed)
 Patient came to the office asking about his lab results. I informed him that the results had not been interpreted at this time. The patient's stated that it had been over a week since the labs were drawn. Please advise.

## 2023-12-15 NOTE — Progress Notes (Signed)
 Please inform blood work shows stable kidney function, normal electrolytes

## 2024-01-18 DIAGNOSIS — R339 Retention of urine, unspecified: Secondary | ICD-10-CM | POA: Diagnosis not present

## 2024-01-19 ENCOUNTER — Telehealth: Payer: Self-pay | Admitting: Cardiology

## 2024-01-19 MED ORDER — CARVEDILOL 3.125 MG PO TABS: 3.1250 mg | ORAL_TABLET | Freq: Two times a day (BID) | ORAL | 3 refills | Status: AC

## 2024-01-19 NOTE — Telephone Encounter (Signed)
*  STAT* If patient is at the pharmacy, call can be transferred to refill team.   1. Which medications need to be refilled? (please list name of each medication and dose if known) carvedilol (COREG) 3.125 MG tablet   2. Which pharmacy/location (including street and city if local pharmacy) is medication to be sent to?  [facility] [place], [place] - [address]      3. Do they need a 30 day or 90 day supply? 90 day

## 2024-01-19 NOTE — Telephone Encounter (Signed)
 Pt's medication was sent to pt's pharmacy as requested. Confirmation received.

## 2024-01-25 ENCOUNTER — Telehealth: Payer: Self-pay

## 2024-01-25 NOTE — Telephone Encounter (Signed)
 Left detailed message per DPR that no medication changes are needed based on BP/HR log. If pt has dizziness/lightheadedness callback.

## 2024-01-26 NOTE — Telephone Encounter (Signed)
 Called the patient and informed him of Dr. Posey recommendation based on his blood pressure log. Patient verbalized understanding and had no further questions at this time.

## 2024-02-22 ENCOUNTER — Ambulatory Visit: Admitting: Podiatry

## 2024-02-22 DIAGNOSIS — B351 Tinea unguium: Secondary | ICD-10-CM | POA: Diagnosis not present

## 2024-02-22 DIAGNOSIS — M79674 Pain in right toe(s): Secondary | ICD-10-CM

## 2024-02-22 DIAGNOSIS — M79675 Pain in left toe(s): Secondary | ICD-10-CM

## 2024-02-22 NOTE — Progress Notes (Signed)
    Subjective:  Patient ID: Robert Webb, male    DOB: 1934-08-26,  MRN: [id number]  Robert Webb presents to clinic today for:  Chief Complaint  Patient presents with   RFC    RFC Blood thinner   Patient notes nails are thick, discolored, elongated and painful in shoegear when trying to ambulate.    PCP is Keren Vicenta BRAVO, MD.  Past Medical History:  Diagnosis Date   Acute ischemic left MCA stroke (HCC) 08/18/2021   Acute systolic CHF (congestive heart failure) (HCC)    Atrial fibrillation (HCC) 08/20/2021   Bacteremia due to Pseudomonas    Cardiomyopathy, idiopathic (HCC) 09/14/2023   History of stroke 06/01/2023   Mixed dyslipidemia 09/22/2021   Pseudomonas pneumonia (HCC)    Severe sepsis with lactic acidosis (HCC)    Past Surgical History:  Procedure Laterality Date   CHOLECYSTECTOMY     HERNIA REPAIR     Allergies  Allergen Reactions   Demerol [Meperidine Hcl] Nausea And Vomiting    Review of Systems: Negative except as noted in the HPI.  Objective:  Robert Webb is a pleasant 88 y.o. male in NAD. AAO x 3.  Vascular Examination: Capillary refill time is 3-5 seconds to toes bilateral. Palpable pedal pulses b/l LE. Digital hair present b/l.  Skin temperature gradient WNL b/l. No varicosities b/l. No cyanosis noted b/l.   Dermatological Examination: Pedal skin with normal turgor, texture and tone b/l. No open wounds. No interdigital macerations b/l. Toenails x10 are 3mm thick, discolored, dystrophic with subungual debris. There is pain with compression of the nail plates.  They are elongated x10  Assessment/Plan: 1. Pain due to onychomycosis of toenails of both feet    The mycotic toenails were sharply debrided x10 with sterile nail nippers and a power debriding burr to decrease bulk/thickness and length.    Return in about 3 months (around 05/22/2024) for RFC.   Awanda CHARM Imperial, DPM, FACFAS [facility]     [address] [place], [place] [address]                Office [phone]  Fax [phone]

## 2024-03-07 ENCOUNTER — Ambulatory Visit

## 2024-03-07 ENCOUNTER — Encounter: Payer: Self-pay | Admitting: Cardiology

## 2024-03-07 ENCOUNTER — Ambulatory Visit: Attending: Cardiology | Admitting: Cardiology

## 2024-03-07 VITALS — BP 116/70 | HR 58 | Ht 67.0 in | Wt 125.0 lb

## 2024-03-07 DIAGNOSIS — R6520 Severe sepsis without septic shock: Secondary | ICD-10-CM

## 2024-03-07 DIAGNOSIS — A419 Sepsis, unspecified organism: Secondary | ICD-10-CM

## 2024-03-07 DIAGNOSIS — I63512 Cerebral infarction due to unspecified occlusion or stenosis of left middle cerebral artery: Secondary | ICD-10-CM

## 2024-03-07 DIAGNOSIS — Z8673 Personal history of transient ischemic attack (TIA), and cerebral infarction without residual deficits: Secondary | ICD-10-CM | POA: Diagnosis not present

## 2024-03-07 DIAGNOSIS — I4821 Permanent atrial fibrillation: Secondary | ICD-10-CM

## 2024-03-07 DIAGNOSIS — E872 Acidosis, unspecified: Secondary | ICD-10-CM

## 2024-03-07 DIAGNOSIS — I429 Cardiomyopathy, unspecified: Secondary | ICD-10-CM

## 2024-03-07 DIAGNOSIS — I517 Cardiomegaly: Secondary | ICD-10-CM | POA: Insufficient documentation

## 2024-03-07 DIAGNOSIS — I371 Nonrheumatic pulmonary valve insufficiency: Secondary | ICD-10-CM | POA: Diagnosis not present

## 2024-03-07 MED ORDER — APIXABAN 2.5 MG PO TABS: 2.5000 mg | ORAL_TABLET | Freq: Two times a day (BID) | ORAL | Status: AC

## 2024-03-07 MED ORDER — EMPAGLIFLOZIN 10 MG PO TABS: 10.0000 mg | ORAL_TABLET | Freq: Every day | ORAL | Status: AC

## 2024-03-07 NOTE — Patient Instructions (Signed)
 Medication Instructions:  Your physician recommends that you continue on your current medications as directed. Please refer to the Current Medication list given to you today.  *If you need a refill on your cardiac medications before your next appointment, please call your pharmacy*   Lab Work: None ordered If you have labs (blood work) drawn today and your tests are completely normal, you will receive your results only by: MyChart Message (if you have MyChart) OR A paper copy in the mail If you have any lab test that is abnormal or we need to change your treatment, we will call you to review the results.  Testing/Procedures: Your physician has requested that you have an echocardiogram before your next visit. Echocardiography is a painless test that uses sound waves to create images of your heart. It provides your doctor with information about the size and shape of your heart and how well your hearts chambers and valves are working. This procedure takes approximately one hour. There are no restrictions for this procedure. Please do NOT wear cologne, perfume, aftershave, or lotions (deodorant is allowed). Please arrive 15 minutes prior to your appointment time.  Please note: We ask at that you not bring children with you during ultrasound (echo/ vascular) testing. Due to room size and safety concerns, children are not allowed in the ultrasound rooms during exams. Our front office staff cannot provide observation of children in our lobby area while testing is being conducted. An adult accompanying a patient to their appointment will only be allowed in the ultrasound room at the discretion of the ultrasound technician under special circumstances. We apologize for any inconvenience.  Follow-Up: At [facility], you and your health needs are our priority.  As part of our continuing mission to provide you with exceptional heart care, we have created designated Provider Care Teams.  These Care Teams  include your primary Cardiologist (physician) and Advanced Practice Providers (APPs -  Physician Assistants and Nurse Practitioners) who all work together to provide you with the care you need, when you need it.  We recommend signing up for the patient portal called MyChart.  Sign up information is provided on this After Visit Summary.  MyChart is used to connect with patients for Virtual Visits (Telemedicine).  Patients are able to view lab/test results, encounter notes, upcoming appointments, etc.  Non-urgent messages can be sent to your provider as well.   To learn more about what you can do with MyChart, go to [email or web address].    Your next appointment:   4 month(s)  The format for your next appointment:   In Person  Provider:   Jennifer Crape, MD   Other Instructions Echocardiogram An echocardiogram is a test that uses sound waves (ultrasound) to produce images of the heart. Images from an echocardiogram can provide important information about: Heart size and shape. The size and thickness and movement of your heart's walls. Heart muscle function and strength. Heart valve function or if you have stenosis. Stenosis is when the heart valves are too narrow. If blood is flowing backward through the heart valves (regurgitation). A tumor or infectious growth around the heart valves. Areas of heart muscle that are not working well because of poor blood flow or injury from a heart attack. Aneurysm detection. An aneurysm is a weak or damaged part of an artery wall. The wall bulges out from the normal force of blood pumping through the body. Tell a health care provider about: Any allergies you have. All medicines you are  taking, including vitamins, herbs, eye drops, creams, and over-the-counter medicines. Any blood disorders you have. Any surgeries you have had. Any medical conditions you have. Whether you are pregnant or may be pregnant. What are the risks? Generally, this is a  safe test. However, problems may occur, including an allergic reaction to dye (contrast) that may be used during the test. What happens before the test? No specific preparation is needed. You may eat and drink normally. What happens during the test? You will take off your clothes from the waist up and put on a hospital gown. Electrodes or electrocardiogram (ECG)patches may be placed on your chest. The electrodes or patches are then connected to a device that monitors your heart rate and rhythm. You will lie down on a table for an ultrasound exam. A gel will be applied to your chest to help sound waves pass through your skin. A handheld device, called a transducer, will be pressed against your chest and moved over your heart. The transducer produces sound waves that travel to your heart and bounce back (or echo back) to the transducer. These sound waves will be captured in real-time and changed into images of your heart that can be viewed on a video monitor. The images will be recorded on a computer and reviewed by your health care provider. You may be asked to change positions or hold your breath for a short time. This makes it easier to get different views or better views of your heart. In some cases, you may receive contrast through an IV in one of your veins. This can improve the quality of the pictures from your heart. The procedure may vary among health care providers and hospitals.   What can I expect after the test? You may return to your normal, everyday life, including diet, activities, and medicines, unless your health care provider tells you not to do that. Follow these instructions at home: It is up to you to get the results of your test. Ask your health care provider, or the department that is doing the test, when your results will be ready. Keep all follow-up visits. This is important. Summary An echocardiogram is a test that uses sound waves (ultrasound) to produce images of the  heart. Images from an echocardiogram can provide important information about the size and shape of your heart, heart muscle function, heart valve function, and other possible heart problems. You do not need to do anything to prepare before this test. You may eat and drink normally. After the echocardiogram is completed, you may return to your normal, everyday life, unless your health care provider tells you not to do that. This information is not intended to replace advice given to you by your health care provider. Make sure you discuss any questions you have with your health care provider. Document Revised: 10/29/2019 Document Reviewed: 10/29/2019 Elsevier Patient Education  2021 Elsevier Inc.   Important Information About Sugar

## 2024-03-07 NOTE — Progress Notes (Signed)
 Cardiology Office Note:    Date:  03/07/2024   ID:  Robert Webb, DOB [birth year], MRN [id number]  PCP:  Robert Vicenta BRAVO, MD  Cardiologist:  Robert JONELLE Crape, MD   Referring MD: Robert Vicenta BRAVO, MD    ASSESSMENT:    1. Permanent atrial fibrillation (HCC)   2. Cardiomyopathy, idiopathic (HCC)   3. Acute ischemic left MCA stroke (HCC)   4. History of stroke   5. Severe sepsis with lactic acidosis (HCC)   6. 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  Importance of compliance with diet medication stressed and patient verbalized standing. Cardiomyopathy: Stable at this time.  CHF education was given.  Patient stable from a CHF perspective.  Will have an echocardiogram done in 4 months when he sees me in follow-up appointment. Essential hypertension: Blood pressure stable and diet was emphasized. Atrial fibrillation:I discussed with the patient atrial fibrillation, disease process. Management and therapy including rate and rhythm control, anticoagulation benefits and potential risks were discussed extensively with the patient. Patient had multiple questions which were answered to patient's satisfaction. Patient will be seen in follow-up appointment in 6 months or earlier if the patient has any concerns.    Medication Adjustments/Labs and Tests Ordered: Current medicines are reviewed at length with the patient today.  Concerns regarding medicines are outlined above.  No orders of the defined types were placed in this encounter.  No orders of the defined types were placed in this encounter.    No chief complaint on file.    History of Present Illness:    Robert Webb is a 89 y.o. male.  Patient has past medical history of atrial fibrillation on anticoagulation, cardiomyopathy, history of stroke.  Patient was admitted to the  hospital with pneumonitis and subsequently discharged.  He is on guideline directed therapy for cardiomyopathy.  He denies any problems at this time.  His blood pressure is fine and he checks his weight on a regular basis.  He is compliant with CHF education.  His wife accompanies him for this visit.  At the time of my evaluation, the patient is alert awake oriented and in no distress.  Past Medical History:  Diagnosis Date   Acute ischemic left MCA stroke (HCC) 08/18/2021   Acute systolic CHF (congestive heart failure) (HCC)    Atrial fibrillation (HCC) 08/20/2021   Bacteremia due to Pseudomonas    Cardiomyopathy (HCC) 09/14/2023   Cardiomyopathy, idiopathic (HCC) 09/14/2023   History of stroke 06/01/2023   Mixed dyslipidemia 09/22/2021   Permanent atrial fibrillation (HCC) 08/20/2021   Pseudomonas pneumonia (HCC)    Severe sepsis with lactic acidosis (HCC)     Past Surgical History:  Procedure Laterality Date   CHOLECYSTECTOMY     HERNIA REPAIR      Current Medications: Active Medications[1]   Allergies:   Demerol [meperidine hcl]   Social History   Socioeconomic History   Marital status: Married    Spouse name: Robert Webb   Number of children: 1   Years of education: Not on file   Highest education level: Not on file  Occupational History   Occupation: retired  Tobacco Use   Smoking status: Former    Types: Cigarettes    Passive exposure: Never   Smokeless tobacco: Never   Tobacco comments:    Pt states he smoked when he  was in high school   Vaping Use   Vaping status: Never Used  Substance and Sexual Activity   Alcohol use: Never   Drug use: Never   Sexual activity: Not Currently  Other Topics Concern   Not on file  Social History Narrative   Not on file   Social Drivers of Health   Tobacco Use: Medium Risk (03/07/2024)   Patient History    Smoking Tobacco Use: Former    Smokeless Tobacco Use: Never    Passive Exposure: Never  Physicist, Medical  Strain: Low Risk (08/18/2021)   Overall Financial Resource Strain (CARDIA)    Difficulty of Paying Living Expenses: Not hard at all  Food Insecurity: No Food Insecurity (08/18/2021)   Hunger Vital Sign    Worried About Running Out of Food in the Last Year: Never true    Ran Out of Food in the Last Year: Never true  Transportation Needs: No Transportation Needs (08/18/2021)   PRAPARE - Administrator, Civil Service (Medical): No    Lack of Transportation (Non-Medical): No  Physical Activity: Not on file  Stress: No Stress Concern Present (08/18/2021)   [employer] of Occupational Health - Occupational Stress Questionnaire    Feeling of Stress : Not at all  Social Connections: Socially Integrated (08/18/2021)   Social Connection and Isolation Panel    Frequency of Communication with Friends and Family: More than three times a week    Frequency of Social Gatherings with Friends and Family: Three times a week    Attends Religious Services: More than 4 times per year    Active Member of Clubs or Organizations: Yes    Attends Banker Meetings: More than 4 times per year    Marital Status: Married  Depression (PHQ2-9): Low Risk (10/11/2021)   Depression (PHQ2-9)    PHQ-2 Score: 0  Alcohol Screen: Low Risk (08/18/2021)   Alcohol Screen    Last Alcohol Screening Score (AUDIT): 0  Housing: Low Risk (08/18/2021)   Housing    Last Housing Risk Score: 0  Utilities: Not on file  Health Literacy: Not on file     Family History: The patient's family history includes Cancer in his mother. There is no history of Hypertension, Diabetes, or Heart disease.  ROS:   Please see the history of present illness.    All other systems reviewed and are negative.  EKGs/Labs/Other Studies Reviewed:    The following studies were reviewed today: .SABRA   I discussed my findings with the patient at length   Recent Labs: 12/07/2023: BUN 24; Creatinine, Ser 0.92; Magnesium 2.2;  Potassium 4.5; Sodium 142  Recent Lipid Panel    Component Value Date/Time   CHOL 132 08/18/2021 0421   TRIG 20 08/18/2021 0421   HDL 50 08/18/2021 0421   CHOLHDL 2.6 08/18/2021 0421   VLDL 4 08/18/2021 0421   LDLCALC 78 08/18/2021 0421    Physical Exam:    VS:  BP 116/70   Pulse (!) 58   Ht 5' 7 (1.702 m)   Wt 125 lb (56.7 kg)   SpO2 96%   BMI 19.58 kg/m     Wt Readings from Last 3 Encounters:  03/07/24 125 lb (56.7 kg)  11/30/23 125 lb 3.2 oz (56.8 kg)  09/14/23 125 lb 3.2 oz (56.8 kg)     GEN: Patient is in no acute distress HEENT: Normal NECK: No JVD; No carotid bruits LYMPHATICS: No lymphadenopathy CARDIAC: Hear sounds regular, 2/6  systolic murmur at the apex. RESPIRATORY:  Clear to auscultation without rales, wheezing or rhonchi  ABDOMEN: Soft, non-tender, non-distended MUSCULOSKELETAL:  No edema; No deformity  SKIN: Warm and dry NEUROLOGIC:  Alert and oriented x 3 PSYCHIATRIC:  Normal affect   Signed, Robert JONELLE Crape, MD  03/07/2024 1:44 PM    [facility]     [1]  Current Meds  Medication Sig   apixaban  (ELIQUIS ) 2.5 MG TABS tablet Take 1 tablet by mouth twice daily   bethanechol (URECHOLINE) 25 MG tablet Take 25 mg by mouth 2 (two) times daily.   carvedilol  (COREG ) 3.125 MG tablet Take 1 tablet (3.125 mg total) by mouth 2 (two) times daily.   dutasteride (AVODART) 0.5 MG capsule Take 0.5 mg by mouth daily.   empagliflozin  (JARDIANCE ) 10 MG TABS tablet Take 1 tablet (10 mg total) by mouth daily before breakfast.   lisinopril  (ZESTRIL ) 5 MG tablet Take 1 tablet (5 mg total) by mouth daily.   rosuvastatin  (CRESTOR ) 10 MG tablet Take 1 tablet by mouth once daily   spironolactone  (ALDACTONE ) 25 MG tablet Take 0.5 tablets (12.5 mg total) by mouth daily.   tamsulosin (FLOMAX) 0.4 MG CAPS capsule Take 0.4 mg by mouth daily.

## 2024-03-08 LAB — BASIC METABOLIC PANEL WITH GFR
BUN/Creatinine Ratio: 28 — ABNORMAL HIGH (ref 10–24)
BUN: 24 mg/dL (ref 8–27)
CO2: 23 mmol/L (ref 20–29)
Calcium: 9.7 mg/dL (ref 8.6–10.2)
Chloride: 105 mmol/L (ref 96–106)
Creatinine, Ser: 0.85 mg/dL (ref 0.76–1.27)
Glucose: 91 mg/dL (ref 70–99)
Potassium: 4.7 mmol/L (ref 3.5–5.2)
Sodium: 142 mmol/L (ref 134–144)
eGFR: 84 mL/min/1.73

## 2024-03-12 ENCOUNTER — Ambulatory Visit: Payer: Self-pay | Admitting: Cardiology

## 2024-04-04 ENCOUNTER — Ambulatory Visit: Attending: Cardiology

## 2024-04-04 DIAGNOSIS — I429 Cardiomyopathy, unspecified: Secondary | ICD-10-CM

## 2024-04-04 LAB — ECHOCARDIOGRAM COMPLETE
AR max vel: 1.68 cm2
AV Area VTI: 1.79 cm2
AV Area mean vel: 1.65 cm2
AV Mean grad: 4 mmHg
AV Peak grad: 7.7 mmHg
Ao pk vel: 1.39 m/s
Area-P 1/2: 3.45 cm2
MV M vel: 3.85 m/s
MV Peak grad: 59.3 mmHg
S' Lateral: 2.9 cm

## 2024-04-18 ENCOUNTER — Other Ambulatory Visit: Payer: Self-pay

## 2024-04-18 MED ORDER — APIXABAN 2.5 MG PO TABS: 2.5000 mg | ORAL_TABLET | Freq: Two times a day (BID) | ORAL | 0 refills | Status: AC

## 2024-05-23 ENCOUNTER — Ambulatory Visit: Admitting: Podiatry
